# Patient Record
Sex: Male | Born: 1990 | Race: White | Hispanic: No | Marital: Single
Health system: Southern US, Community
[De-identification: ages and names within clinical notes are randomized; demographics above are authoritative.]

---
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
## 2011-06-23 LAB — URINALYSIS, ROUTINE W REFLEX MICROSCOPIC
Bilirubin Urine: NEGATIVE
Glucose, UA: NEGATIVE
Hgb urine dipstick: NEGATIVE
Ketones, ur: NEGATIVE
Protein, ur: NEGATIVE
pH: 6

## 2011-06-23 LAB — CBC
HCT: 44.4
Hemoglobin: 16
MCHC: 36
RDW: 13.1

## 2011-06-23 LAB — DIFFERENTIAL
Basophils Absolute: 0.1
Basophils Relative: 1
Eosinophils Relative: 7 — ABNORMAL HIGH
Monocytes Absolute: 1.1
Monocytes Relative: 10

## 2011-06-23 LAB — BASIC METABOLIC PANEL
CO2: 26
Chloride: 103
Glucose, Bld: 91
Potassium: 3.8
Sodium: 140

## 2011-06-23 LAB — DRUGS OF ABUSE SCREEN W/O ALC, ROUTINE URINE
Barbiturate Quant, Ur: NEGATIVE
Benzodiazepines.: NEGATIVE
Cocaine Metabolites: NEGATIVE
Methadone: NEGATIVE
Phencyclidine (PCP): NEGATIVE

## 2011-06-23 LAB — HEPATIC FUNCTION PANEL
ALT: 17
Albumin: 3.6
Alkaline Phosphatase: 91
Total Bilirubin: 1.3 — ABNORMAL HIGH

## 2011-06-23 LAB — THC (MARIJUANA), URINE, CONFIRMATION: Marijuana, Ur-Confirmation: 400 ng/mL

## 2011-06-23 LAB — RPR: RPR Ser Ql: NONREACTIVE

## 2020-08-23 ENCOUNTER — Other Ambulatory Visit: Payer: Self-pay

## 2020-08-23 ENCOUNTER — Inpatient Hospital Stay (HOSPITAL_COMMUNITY)
Admission: EM | Admit: 2020-08-23 | Discharge: 2020-09-12 | DRG: 871 | Disposition: E | Payer: Medicaid Other | Attending: Internal Medicine | Admitting: Internal Medicine

## 2020-08-23 ENCOUNTER — Emergency Department (HOSPITAL_COMMUNITY): Payer: Medicaid Other

## 2020-08-23 DIAGNOSIS — I469 Cardiac arrest, cause unspecified: Secondary | ICD-10-CM | POA: Diagnosis present

## 2020-08-23 DIAGNOSIS — I5021 Acute systolic (congestive) heart failure: Secondary | ICD-10-CM | POA: Diagnosis not present

## 2020-08-23 DIAGNOSIS — J690 Pneumonitis due to inhalation of food and vomit: Secondary | ICD-10-CM | POA: Diagnosis present

## 2020-08-23 DIAGNOSIS — G936 Cerebral edema: Secondary | ICD-10-CM | POA: Diagnosis present

## 2020-08-23 DIAGNOSIS — Z9289 Personal history of other medical treatment: Secondary | ICD-10-CM

## 2020-08-23 DIAGNOSIS — E876 Hypokalemia: Secondary | ICD-10-CM | POA: Diagnosis not present

## 2020-08-23 DIAGNOSIS — G929 Unspecified toxic encephalopathy: Secondary | ICD-10-CM | POA: Diagnosis present

## 2020-08-23 DIAGNOSIS — G9382 Brain death: Secondary | ICD-10-CM | POA: Diagnosis present

## 2020-08-23 DIAGNOSIS — N179 Acute kidney failure, unspecified: Secondary | ICD-10-CM | POA: Diagnosis present

## 2020-08-23 DIAGNOSIS — G931 Anoxic brain damage, not elsewhere classified: Secondary | ICD-10-CM | POA: Diagnosis present

## 2020-08-23 DIAGNOSIS — Z01818 Encounter for other preprocedural examination: Secondary | ICD-10-CM

## 2020-08-23 DIAGNOSIS — E860 Dehydration: Secondary | ICD-10-CM | POA: Diagnosis present

## 2020-08-23 DIAGNOSIS — R6520 Severe sepsis without septic shock: Secondary | ICD-10-CM | POA: Diagnosis present

## 2020-08-23 DIAGNOSIS — J9601 Acute respiratory failure with hypoxia: Secondary | ICD-10-CM | POA: Diagnosis present

## 2020-08-23 DIAGNOSIS — Z515 Encounter for palliative care: Secondary | ICD-10-CM | POA: Diagnosis not present

## 2020-08-23 DIAGNOSIS — Z66 Do not resuscitate: Secondary | ICD-10-CM | POA: Diagnosis not present

## 2020-08-23 DIAGNOSIS — R739 Hyperglycemia, unspecified: Secondary | ICD-10-CM | POA: Diagnosis present

## 2020-08-23 DIAGNOSIS — Z20822 Contact with and (suspected) exposure to covid-19: Secondary | ICD-10-CM | POA: Diagnosis present

## 2020-08-23 DIAGNOSIS — G935 Compression of brain: Secondary | ICD-10-CM | POA: Diagnosis present

## 2020-08-23 DIAGNOSIS — A419 Sepsis, unspecified organism: Principal | ICD-10-CM | POA: Diagnosis present

## 2020-08-23 DIAGNOSIS — Z529 Donor of unspecified organ or tissue: Secondary | ICD-10-CM

## 2020-08-23 LAB — I-STAT ARTERIAL BLOOD GAS, ED
Acid-Base Excess: 0 mmol/L (ref 0.0–2.0)
Acid-base deficit: 14 mmol/L — ABNORMAL HIGH (ref 0.0–2.0)
Bicarbonate: 18.9 mmol/L — ABNORMAL LOW (ref 20.0–28.0)
Bicarbonate: 25.2 mmol/L (ref 20.0–28.0)
Calcium, Ion: 1.36 mmol/L (ref 1.15–1.40)
Calcium, Ion: 1.19 mmol/L (ref 1.15–1.40)
HCT: 38 % — ABNORMAL LOW (ref 39.0–52.0)
HCT: 41 % (ref 39.0–52.0)
Hemoglobin: 12.9 g/dL — ABNORMAL LOW (ref 13.0–17.0)
Hemoglobin: 13.9 g/dL (ref 13.0–17.0)
O2 Saturation: 90 %
O2 Saturation: 100 %
Patient temperature: 98.6
Patient temperature: 92.5
Potassium: 3.3 mmol/L — ABNORMAL LOW (ref 3.5–5.1)
Potassium: 3.2 mmol/L — ABNORMAL LOW (ref 3.5–5.1)
Sodium: 133 mmol/L — ABNORMAL LOW (ref 135–145)
Sodium: 136 mmol/L (ref 135–145)
TCO2: 21 mmol/L — ABNORMAL LOW (ref 22–32)
TCO2: 27 mmol/L (ref 22–32)
pCO2 arterial: 79.9 mmHg (ref 32.0–48.0)
pCO2 arterial: 36.6 mmHg (ref 32.0–48.0)
pH, Arterial: 6.982 — CL (ref 7.350–7.450)
pH, Arterial: 7.432 (ref 7.350–7.450)
pO2, Arterial: 91 mmHg (ref 83.0–108.0)
pO2, Arterial: 489 mmHg — ABNORMAL HIGH (ref 83.0–108.0)

## 2020-08-23 LAB — CBC WITH DIFFERENTIAL/PLATELET
Abs Immature Granulocytes: 0.75 10*3/uL — ABNORMAL HIGH (ref 0.00–0.07)
Basophils Absolute: 0.1 10*3/uL (ref 0.0–0.1)
Basophils Relative: 1 %
Eosinophils Absolute: 0.3 10*3/uL (ref 0.0–0.5)
Eosinophils Relative: 2 %
HCT: 40.5 % (ref 39.0–52.0)
Hemoglobin: 12.1 g/dL — ABNORMAL LOW (ref 13.0–17.0)
Immature Granulocytes: 7 %
Lymphocytes Relative: 52 %
Lymphs Abs: 5.5 10*3/uL — ABNORMAL HIGH (ref 0.7–4.0)
MCH: 30.7 pg (ref 26.0–34.0)
MCHC: 29.9 g/dL — ABNORMAL LOW (ref 30.0–36.0)
MCV: 102.8 fL — ABNORMAL HIGH (ref 80.0–100.0)
Monocytes Absolute: 0.5 10*3/uL (ref 0.1–1.0)
Monocytes Relative: 5 %
Neutro Abs: 3.5 10*3/uL (ref 1.7–7.7)
Neutrophils Relative %: 33 %
Platelets: 252 10*3/uL (ref 150–400)
RBC: 3.94 MIL/uL — ABNORMAL LOW (ref 4.22–5.81)
RDW: 12.6 % (ref 11.5–15.5)
WBC: 10.6 10*3/uL — ABNORMAL HIGH (ref 4.0–10.5)
nRBC: 0 % (ref 0.0–0.2)

## 2020-08-23 LAB — I-STAT CHEM 8, ED
BUN: 7 mg/dL — ABNORMAL LOW (ref 8–23)
Calcium, Ion: 1.46 mmol/L — ABNORMAL HIGH (ref 1.15–1.40)
Chloride: 103 mmol/L (ref 98–111)
Creatinine, Ser: 0.7 mg/dL (ref 0.61–1.24)
Glucose, Bld: 196 mg/dL — ABNORMAL HIGH (ref 70–99)
HCT: 31 % — ABNORMAL LOW (ref 39.0–52.0)
Hemoglobin: 10.5 g/dL — ABNORMAL LOW (ref 13.0–17.0)
Potassium: 4.4 mmol/L (ref 3.5–5.1)
Sodium: 134 mmol/L — ABNORMAL LOW (ref 135–145)
TCO2: 20 mmol/L — ABNORMAL LOW (ref 22–32)

## 2020-08-23 LAB — RAPID URINE DRUG SCREEN, HOSP PERFORMED
Amphetamines: POSITIVE — AB
Barbiturates: NOT DETECTED
Benzodiazepines: POSITIVE — AB
Cocaine: NOT DETECTED
Opiates: NOT DETECTED
Tetrahydrocannabinol: NOT DETECTED

## 2020-08-23 LAB — ETHANOL: Alcohol, Ethyl (B): <10 mg/dL (ref <10)

## 2020-08-23 LAB — CBG MONITORING, ED: Glucose-Capillary: 257 mg/dL — ABNORMAL HIGH (ref 70–99)

## 2020-08-23 LAB — HEMOGLOBIN A1C
Hgb A1c MFr Bld: 4.6 % — ABNORMAL LOW (ref 4.8–5.6)
Mean Plasma Glucose: 85.32 mg/dL

## 2020-08-23 LAB — RESP PANEL BY RT-PCR (FLU A&B, COVID) ARPGX2
Influenza A by PCR: NEGATIVE
Influenza B by PCR: NEGATIVE
SARS Coronavirus 2 by RT PCR: NEGATIVE

## 2020-08-23 LAB — LACTIC ACID, PLASMA
Lactic Acid, Venous: >11 mmol/L (ref 0.5–1.9)
Lactic Acid, Venous: 7.8 mmol/L (ref 0.5–1.9)
Lactic Acid, Venous: 4.2 mmol/L (ref 0.5–1.9)

## 2020-08-23 LAB — SALICYLATE LEVEL: Salicylate Lvl: <7 mg/dL — ABNORMAL LOW (ref 7.0–30.0)

## 2020-08-23 LAB — GLUCOSE, CAPILLARY: Glucose-Capillary: 247 mg/dL — ABNORMAL HIGH (ref 70–99)

## 2020-08-23 LAB — ACETAMINOPHEN LEVEL: Acetaminophen (Tylenol), Serum: <10 ug/mL — ABNORMAL LOW (ref 10–30)

## 2020-08-23 LAB — MAGNESIUM: Magnesium: 4.6 mg/dL — ABNORMAL HIGH (ref 1.7–2.4)

## 2020-08-23 LAB — TROPONIN I (HIGH SENSITIVITY)
Troponin I (High Sensitivity): 46 ng/L — ABNORMAL HIGH (ref <18)
Troponin I (High Sensitivity): 674 ng/L (ref <18)

## 2020-08-23 MED ORDER — AMIODARONE HCL 150 MG/3ML IV SOLN
INTRAVENOUS | Status: AC | PRN
Start: 2020-08-23 — End: 2020-08-23
  Administered 2020-08-23: 300 mg via INTRAVENOUS

## 2020-08-23 MED ORDER — EPINEPHRINE HCL 5 MG/250ML IV SOLN IN NS
INTRAVENOUS | Status: AC
  Filled 2020-08-23: qty 250

## 2020-08-23 MED ORDER — PANTOPRAZOLE SODIUM 40 MG PO TBEC: 40.0000 mg | DELAYED_RELEASE_TABLET | Freq: Every day | ORAL | Status: DC

## 2020-08-23 MED ORDER — POLYETHYLENE GLYCOL 3350 17 G PO PACK: 17.0000 g | PACK | Freq: Every day | ORAL | Status: DC | PRN

## 2020-08-23 MED ORDER — MAGNESIUM SULFATE 50 % IJ SOLN
INTRAMUSCULAR | Status: AC | PRN
  Administered 2020-08-23: 2 g via INTRAVENOUS

## 2020-08-23 MED ORDER — HEPARIN SODIUM (PORCINE) 5000 UNIT/ML IJ SOLN
5000.0000 [IU] | Freq: Three times a day (TID) | INTRAMUSCULAR | Status: DC
  Administered 2020-08-23 – 2020-08-26 (×10): 5000 [IU] via SUBCUTANEOUS
  Filled 2020-08-23 (×10): qty 1

## 2020-08-23 MED ORDER — EPINEPHRINE HCL 5 MG/250ML IV SOLN IN NS: 0.5000 ug/min | INTRAVENOUS | Status: DC

## 2020-08-23 MED ORDER — NALOXONE HCL 2 MG/2ML IJ SOSY
PREFILLED_SYRINGE | INTRAMUSCULAR | Status: AC | PRN
Start: 2020-08-23 — End: 2020-08-23
  Administered 2020-08-23: 2 mg via INTRAVENOUS

## 2020-08-23 MED ORDER — MIDAZOLAM HCL 2 MG/2ML IJ SOLN: 2.0000 mg | INTRAMUSCULAR | Status: DC | PRN

## 2020-08-23 MED ORDER — NOREPINEPHRINE 4 MG/250ML-% IV SOLN
INTRAVENOUS | Status: AC
  Administered 2020-08-23: 17:00:00 120 ug/min via INTRAVENOUS
  Filled 2020-08-23: qty 250

## 2020-08-23 MED ORDER — NOREPINEPHRINE 4 MG/250ML-% IV SOLN: 0.0000 ug/min | INTRAVENOUS | Status: DC

## 2020-08-23 MED ORDER — INSULIN ASPART 100 UNIT/ML ~~LOC~~ SOLN
0.0000 [IU] | SUBCUTANEOUS | Status: DC
  Administered 2020-08-24: 3 [IU] via SUBCUTANEOUS
  Administered 2020-08-24 – 2020-08-26 (×4): 1 [IU] via SUBCUTANEOUS

## 2020-08-23 MED ORDER — SODIUM BICARBONATE 8.4 % IV SOLN
INTRAVENOUS | Status: AC | PRN
  Administered 2020-08-23: 50 meq via INTRAVENOUS

## 2020-08-23 MED ORDER — CALCIUM CHLORIDE 10 % IV SOLN
INTRAVENOUS | Status: AC | PRN
  Administered 2020-08-23: 1 g via INTRAVENOUS

## 2020-08-23 MED ORDER — DOCUSATE SODIUM 100 MG PO CAPS: 100.0000 mg | ORAL_CAPSULE | Freq: Two times a day (BID) | ORAL | Status: DC | PRN

## 2020-08-23 MED ORDER — CHLORHEXIDINE GLUCONATE 0.12% ORAL RINSE (MEDLINE KIT)
15.0000 mL | Freq: Two times a day (BID) | OROMUCOSAL | Status: DC
  Administered 2020-08-24 – 2020-08-28 (×10): 15 mL via OROMUCOSAL

## 2020-08-23 MED ORDER — ORAL CARE MOUTH RINSE
15.0000 mL | OROMUCOSAL | Status: DC
  Administered 2020-08-24 – 2020-08-28 (×44): 15 mL via OROMUCOSAL

## 2020-08-23 MED ORDER — STERILE WATER FOR INJECTION IV SOLN
INTRAVENOUS | Status: AC
  Filled 2020-08-23 (×2): qty 850

## 2020-08-23 MED ORDER — EPINEPHRINE 1 MG/10ML IJ SOSY
PREFILLED_SYRINGE | INTRAMUSCULAR | Status: AC | PRN
Start: 2020-08-23 — End: 2020-08-23
  Administered 2020-08-23 (×4): 1 mg via INTRAVENOUS

## 2020-08-23 MED ORDER — NALOXONE HCL 2 MG/2ML IJ SOSY
PREFILLED_SYRINGE | INTRAMUSCULAR | Status: AC
  Filled 2020-08-23: qty 4

## 2020-08-23 NOTE — ED Provider Notes (Signed)
[facility] EMERGENCY DEPARTMENT Provider Note   CSN: [id number] Arrival date & time: 09/09/2020  1639     History CC: Cardiac arrest  Gevena Barre Doe is a 29 y.o. male is a ~89 yoM with unknown past medical history who presented in cardiac arrest.  Per EMS report, patient was found down at [place], pulseless and apneic.  Per EMS, he was sitting on the ground with his head down in a backpack with surrounding emesis.  There was initial concern for drug overdose.  Patient was initially in PEA so CPR was started around 1600.  Patient continued to be in PEA so was given 150 mg amiodarone, epi x6.  He was then found to be in V. fib so was shocked once.  He was also intubated with an 8 oh ET tube in the field.  CPR was continued patient was brought to the ED for further management.  There is no known history on the patient.    The history is provided by the EMS personnel. The history is limited by the condition of the patient.       No past medical history on file.  Patient Active Problem List   Diagnosis Date Noted  . Cardiac arrest ([place]) 08/25/2020       No family history on file.     Home Medications Prior to Admission medications   Not on File    Allergies    Patient has no allergy information on record.  Review of Systems   Review of Systems  Unable to perform ROS: Patient unresponsive    Physical Exam Updated Vital Signs BP 137/77   Pulse (!) 47   Temp (!) 95 F (35 C)   Resp (!) 27   Ht 5' 9"  (1.753 m)   Wt 70 kg   SpO2 100%   BMI 22.79 kg/m   Physical Exam Constitutional:      General: He is in acute distress.     Appearance: He is normal weight. He is ill-appearing and toxic-appearing.     Interventions: He is intubated.  HENT:     Head: Normocephalic and atraumatic.     Right Ear: External ear normal.     Left Ear: External ear normal.  Eyes:     Pupils: Pupils are equal.     Right eye: Pupil is not reactive. Pupil is  round.     Left eye: Pupil is not reactive. Pupil is round.     Comments: 60m, nonreactive pupils bilaterally  Neck:     Comments: In c-collar Cardiovascular:     [facility]: [facility]:     Effort: He is intubated.     Comments: Bilateral breath sounds Abdominal:     General: There is no distension.  Musculoskeletal:     Right lower leg: No edema.     Left lower leg: No edema.  Skin:    General: Skin is dry.  Neurological:     Mental Status: He is unresponsive.     GCS: GCS eye subscore is 1. GCS verbal subscore is 1. GCS motor subscore is 1.     Comments: No posturing.  No response to pain.     ED Results / Procedures / Treatments   Labs (all labs ordered are listed, but only abnormal results are displayed) Labs Reviewed  CBC WITH DIFFERENTIAL/PLATELET - Abnormal; Notable for the following components:      Result Value  WBC 10.6 (*)    RBC 3.94 (*)    Hemoglobin 12.1 (*)    MCV 102.8 (*)    MCHC 29.9 (*)    Lymphs Abs 5.5 (*)    Abs Immature Granulocytes 0.75 (*)    All other components within normal limits  COMPREHENSIVE METABOLIC PANEL - Abnormal; Notable for the following components:   Sodium 133 (*)    Chloride 97 (*)    CO2 17 (*)    Glucose, Bld 266 (*)    BUN 6 (*)    Calcium 10.6 (*)    Total Protein 4.7 (*)    Albumin 2.5 (*)    AST 97 (*)    ALT 62 (*)    Anion gap 19 (*)    All other components within normal limits  LACTIC ACID, PLASMA - Abnormal; Notable for the following components:   Lactic Acid, Venous >11.0 (*)    All other components within normal limits  LACTIC ACID, PLASMA - Abnormal; Notable for the following components:   Lactic Acid, Venous 7.8 (*)    All other components within normal limits  ACETAMINOPHEN LEVEL - Abnormal; Notable for the following components:   Acetaminophen (Tylenol), Serum <10 (*)    All other components within normal limits  SALICYLATE LEVEL - Abnormal; Notable for the  following components:   Salicylate Lvl <7.1 (*)    All other components within normal limits  MAGNESIUM - Abnormal; Notable for the following components:   Magnesium 4.6 (*)    All other components within normal limits  RAPID URINE DRUG SCREEN, HOSP PERFORMED - Abnormal; Notable for the following components:   Benzodiazepines POSITIVE (*)    Amphetamines POSITIVE (*)    All other components within normal limits  LACTIC ACID, PLASMA - Abnormal; Notable for the following components:   Lactic Acid, Venous 4.2 (*)    All other components within normal limits  HEMOGLOBIN A1C - Abnormal; Notable for the following components:   Hgb A1c MFr Bld 4.6 (*)    All other components within normal limits  GLUCOSE, CAPILLARY - Abnormal; Notable for the following components:   Glucose-Capillary 247 (*)    All other components within normal limits  I-STAT CHEM 8, ED - Abnormal; Notable for the following components:   Sodium 134 (*)    BUN 7 (*)    Glucose, Bld 196 (*)    Calcium, Ion 1.46 (*)    TCO2 20 (*)    Hemoglobin 10.5 (*)    HCT 31.0 (*)    All other components within normal limits  CBG MONITORING, ED - Abnormal; Notable for the following components:   Glucose-Capillary 257 (*)    All other components within normal limits  I-STAT ARTERIAL BLOOD GAS, ED - Abnormal; Notable for the following components:   pH, Arterial 6.982 (*)    pCO2 arterial 79.9 (*)    Bicarbonate 18.9 (*)    TCO2 21 (*)    Acid-base deficit 14.0 (*)    Sodium 133 (*)    Potassium 3.3 (*)    HCT 38.0 (*)    Hemoglobin 12.9 (*)    All other components within normal limits  I-STAT ARTERIAL BLOOD GAS, ED - Abnormal; Notable for the following components:   pO2, Arterial 489 (*)    Potassium 3.2 (*)    All other components within normal limits  TROPONIN I (HIGH SENSITIVITY) - Abnormal; Notable for the following components:   Troponin I (High Sensitivity) 46 (*)  All other components within normal limits  TROPONIN  I (HIGH SENSITIVITY) - Abnormal; Notable for the following components:   Troponin I (High Sensitivity) 674 (*)    All other components within normal limits  RESP PANEL BY RT-PCR (FLU A&B, COVID) ARPGX2  CULTURE, BLOOD (ROUTINE X 2)  CULTURE, BLOOD (ROUTINE X 2)  ETHANOL  BLOOD GAS, ARTERIAL  DRUG SCREEN 10 W/CONF, SERUM  BLOOD GAS, ARTERIAL  CBC  BASIC METABOLIC PANEL  MAGNESIUM  PATHOLOGIST SMEAR REVIEW  LACTIC ACID, PLASMA    EKG EKG Interpretation  Date/Time:  Sunday August 23 2020 17:34:29 EST Ventricular Rate:  86 PR Interval:    QRS Duration: 114 QT Interval:  430 QTC Calculation: 515 R Axis:   73 Text Interpretation: Sinus rhythm Borderline intraventricular conduction delay Prolonged QT interval No previous ECGs available Confirmed by Wandra Arthurs [phone]) on 09/04/2020 5:36:51 PM   Radiology CT Head Wo Contrast  Result Date: 08/30/2020 CLINICAL DATA:  Found pulseless, apneic EXAM: CT HEAD WITHOUT CONTRAST CT CERVICAL SPINE WITHOUT CONTRAST TECHNIQUE: Multidetector CT imaging of the head and cervical spine was performed following the standard protocol without intravenous contrast. Multiplanar CT image reconstructions of the cervical spine were also generated. COMPARISON:  None. FINDINGS: CT HEAD FINDINGS Brain: There is subtle, diffuse hypodensity of the brain parenchyma, loss of gray-white distinction, sulcal effacement, and pseudohyperdensity and effacement of the basal cisterns (series 3, image 13). Vascular: No hyperdense vessel or unexpected calcification. Skull: Normal. Negative for fracture or focal lesion. Sinuses/Orbits: Mucosal thickening and partial opacification of ethmoid air cells, bilateral maxillary sinuses, and sphenoid sinuses. Other: None. CT CERVICAL SPINE FINDINGS Examination of the cervical spine is limited by motion artifact. Alignment: Straightening of the normal cervical lordosis, likely positional. Skull base and vertebrae: No definite evidence of  fracture. There is a motion artifact extending through the base of the odontoid process and C2 vertebral body (series 7, image 29). No primary bone lesion or focal pathologic process. Soft tissues and spinal canal: No prevertebral fluid or swelling. No visible canal hematoma. Disc levels:  Intact. Upper chest: Negative. Other: None. IMPRESSION: 1. There is subtle, diffuse hypodensity of the brain parenchyma, loss of gray-white distinction, sulcal effacement, and pseudohyperdensity and effacement of the basal cisterns. Findings are consistent with diffuse cerebral edema and global anoxic injury. 2. Examination of the cervical spine is limited by motion artifact. No definite evidence of fracture or static subluxation of the cervical spine. There is a motion artifact extending through the base of the odontoid process and C2 vertebral body, not favored to reflect a fracture. These results were called by telephone at the time of interpretation on 08/17/2020 at 7:03 pm to Dr. Shirlyn Goltz , who verbally acknowledged these results. Electronically Signed   By: Eddie Candle M.D.   On: 08/17/2020 19:07   CT Cervical Spine Wo Contrast  Result Date: 09/06/2020 CLINICAL DATA:  Found pulseless, apneic EXAM: CT HEAD WITHOUT CONTRAST CT CERVICAL SPINE WITHOUT CONTRAST TECHNIQUE: Multidetector CT imaging of the head and cervical spine was performed following the standard protocol without intravenous contrast. Multiplanar CT image reconstructions of the cervical spine were also generated. COMPARISON:  None. FINDINGS: CT HEAD FINDINGS Brain: There is subtle, diffuse hypodensity of the brain parenchyma, loss of gray-white distinction, sulcal effacement, and pseudohyperdensity and effacement of the basal cisterns (series 3, image 13). Vascular: No hyperdense vessel or unexpected calcification. Skull: Normal. Negative for fracture or focal lesion. Sinuses/Orbits: Mucosal thickening and partial opacification of ethmoid  air cells,  bilateral maxillary sinuses, and sphenoid sinuses. Other: None. CT CERVICAL SPINE FINDINGS Examination of the cervical spine is limited by motion artifact. Alignment: Straightening of the normal cervical lordosis, likely positional. Skull base and vertebrae: No definite evidence of fracture. There is a motion artifact extending through the base of the odontoid process and C2 vertebral body (series 7, image 29). No primary bone lesion or focal pathologic process. Soft tissues and spinal canal: No prevertebral fluid or swelling. No visible canal hematoma. Disc levels:  Intact. Upper chest: Negative. Other: None. IMPRESSION: 1. There is subtle, diffuse hypodensity of the brain parenchyma, loss of gray-white distinction, sulcal effacement, and pseudohyperdensity and effacement of the basal cisterns. Findings are consistent with diffuse cerebral edema and global anoxic injury. 2. Examination of the cervical spine is limited by motion artifact. No definite evidence of fracture or static subluxation of the cervical spine. There is a motion artifact extending through the base of the odontoid process and C2 vertebral body, not favored to reflect a fracture. These results were called by telephone at the time of interpretation on 09/04/2020 at 7:03 pm to Dr. Shirlyn Goltz , who verbally acknowledged these results. Electronically Signed   By: Eddie Candle M.D.   On: 08/30/2020 19:07   DG Chest Port 1 View  Result Date: 08/18/2020 CLINICAL DATA:  Intubation, central line placement EXAM: PORTABLE CHEST 1 VIEW COMPARISON:  None. FINDINGS: Endotracheal tube tip terminates in the mid to upper trachea, 6.2 cm from the carina. Transesophageal tube tip and side port terminate below the margins of imaging, beyond the GE junction. Telemetry leads and pacer pads overlie the chest wall. Central venous catheter is not visualized within the margins of imaging. There is some mild airways thickening and mixed streaky and patchy opacity in the  medial lung bases. The cardiomediastinal contours are unremarkable. No acute osseous or soft tissue abnormality. IMPRESSION: 1. Endotracheal tube tip terminates in the mid to upper trachea, 6.2 cm from the carina. 2. Transesophageal tube tip and side port terminate below the margins of imaging, beyond the GE junction. 3. A central venous catheter is not visualized within the margins of imaging. 4. Airways thickening and mixed interstitial and patchy opacities, could reflect acute infection or inflammation in the appropriate clinical setting. Electronically Signed   By: Lovena Le M.D.   On: 09/01/2020 17:48    Procedures .Central Line  Date/Time: 09/11/2020 11:51 PM Performed by: Darrick Huntsman, MD Authorized by: Drenda Freeze, MD   Consent:    Consent obtained:  Emergent situation Pre-procedure details:    Indication(s): central venous access and insufficient peripheral access     All elements of maximal sterile barrier technique followed: Sterile techniques were followed the best of our ability given emergent situationand limitations of this.     Skin preparation:  Povidone-iodine   Skin preparation agent: Skin preparation agent completely dried prior to procedure   Sedation:    Sedation type:  None Procedure details:    Location:  R femoral   Site selection rationale:  C-collar so IJ not accessible, c/f neck trauma   Patient position:  Supine   Procedural supplies:  Triple lumen   Catheter size:  7 Fr   Landmarks identified: yes     Ultrasound guidance: yes     Ultrasound guidance timing: real time     Sterile ultrasound techniques: Sterile gel and sterile probe covers were used     Number of attempts:  1  Successful placement: yes   Post-procedure details:    Post-procedure:  Dressing applied and line sutured   Assessment:  Blood return through all ports and free fluid flow   Procedure completion:  Tolerated well, no immediate complications     Medications Ordered  in ED Medications  naloxone (NARCAN) 2 MG/2ML injection (has no administration in time range)  EPINEPHrine NaCl 4-0.9 MG/250ML-% premix infusion (has no administration in time range)  norepinephrine (LEVOPHED) 18m in 2552mpremix infusion (0 mcg/min Intravenous Stopped 08/22/2020 1750)  EPINEPHrine (ADRENALIN) 4 mg in NS 250 mL (0.016 mg/mL) premix infusion (0.5 mcg/min Intravenous Not Given 09/09/2020 1752)  sodium bicarbonate 150 mEq in sterile water 1,000 mL infusion ( Intravenous Infusion Verify 09/03/2020 2200)  docusate sodium (COLACE) capsule 100 mg (has no administration in time range)  polyethylene glycol (MIRALAX / GLYCOLAX) packet 17 g (has no administration in time range)  heparin injection 5,000 Units (5,000 Units Subcutaneous Given 08/24/2020 2314)  pantoprazole (PROTONIX) EC tablet 40 mg (40 mg Oral Not Given 08/19/2020 1913)  midazolam (VERSED) injection 2 mg (has no administration in time range)  insulin aspart (novoLOG) injection 0-9 Units (has no administration in time range)  chlorhexidine gluconate (MEDLINE KIT) (PERIDEX) 0.12 % solution 15 mL (has no administration in time range)  MEDLINE mouth rinse (has no administration in time range)  EPINEPHrine (ADRENALIN) 1 MG/10ML injection (1 mg Intravenous Given 09/04/2020 1651)  sodium bicarbonate injection (50 mEq Intravenous Given 08/14/2020 1642)  calcium chloride injection (1 g Intravenous Given 09/08/2020 1644)  magnesium sulfate (IV Push/IM) injection (2 g Intravenous Given 09/03/2020 1647)  amiodarone (CORDARONE) injection (300 mg Intravenous Given 08/30/2020 1649)  naloxone (NARCAN) injection (2 mg Intravenous Given 09/06/2020 1653)  sodium bicarbonate injection (50 mEq Intravenous Given 08/19/2020 1751)    ED Course  I have reviewed the triage vital signs and the nursing notes.  Pertinent labs & imaging results that were available during my care of the patient were reviewed by me and considered in my medical decision making (see chart for  details).    MDM Rules/Calculators/A&P                          ED Course/MDM: PoTrellis Momentoe is a [age].o. male who presents with cardiac arrest as per above. I have reviewed the nursing documentation for past medical history, family history, and social history. Pertinent previous records reviewed. He is unresponsive, intubated without sedation.  93.1 F. Physical exam is most notable for pulseless.  Labs: ABG 6.982/79.9/91, Covid negative, glucose 257, WBC 10.6, hemoglobin 12.1, bicarb 17, mild transaminitis, AG 19, undetectable ethanol, troponin 46.  Lactic above measurable limit.  UDS positive for benzos and amphetamines.  Remaining lab work pending at time of admission. EKG: NSR, nonspecific ST changes.Borderline prolonged QTc. No signs of acute ischemia, infarct, or significant electrical abnormalities. No STEMI, ST depressions, or significant T wave inversions. No evidence of a High-Grade Conduction Block, WPW, Brugada Sign, ARVC, DeWinters T Waves, or Wellens Waves. Imaging: CT head and C-spine demonstrating findings concerning for diffuse cerebral edema and global anoxic injury.  No obvious cervical spine fracture.  CXR with a ET tube in trachea; 6.2 cm from carina. Consults: Crit care   MDM: PoGevena Barreoe is a [age].o. male presents with out-of-hospital cardiac arrest.  On patient arrival, had been undergoing CPR for about 40 minutes with epi x6 and shocked x1.  CPR was continued and patient was given  epinephrine, sodium bicarb, calcium chloride.  Patient underwent multiple rounds of CPR.  Had PEA but then on repeat pulse check, had V. fib so patient was shocked.  Given 300 mg amiodarone.  CPR restarted and on subsequent rhythm checks, had V. fib versus torsades.  Thus patient was given magnesium, shocked, and CPR was continued.  Patient was given an additional 2 mg Narcan.  After about 54 total minutes of prehospital and in hospital CPR, the patient's pulses returned.  He remained  hypotensive so was placed on epi drip and then levo drip.  Max levo was at 120.  Patient did not have another code event while in the ED.  Placed on vent via ET tube placed by EMS.  Patient maintained a good pulse and perfusion with assistance of pressors which were weaned.  There are patient's 80s care, he did not make any purposeful movements and there is concern for severe underlying hypoxic brain injury.  Nonsterile/crash central line placed as above. Handoff given to critical care team including nonsterile technique of central line.  Patient admitted in tenuous but stable condition on pressors.  The plan for this patient was discussed with Dr. Darl Householder, who voiced agreement and who oversaw evaluation and treatment of this patient.   Final Clinical Impression(s) / ED Diagnoses Final diagnoses:  Cardiac arrest [facility])    Rx / DC Orders ED Discharge Orders    None       Aundrea Horace, MD 08/22/2020 2355    Drenda Freeze, MD 08/29/20 [phone]

## 2020-08-23 NOTE — Progress Notes (Signed)
Called to patients room patient was desating in the 80's.  Increased FI02 to 100% and placed a bite block due to patient biting tube.  RT will continue to monitor and titrate FI02 as tolerated.

## 2020-08-23 NOTE — Progress Notes (Addendum)
eLink Physician-Brief Progress Note Patient Name: Terry Mathews DOB: 09/12/1875 MRN: [id number]   Date of Service  08/22/2020  HPI/Events of Note  Multiple issues: 1. Patient biting on ETT --> desaturation and 2. Hyperglycemia - Blood glucose = 257.   eICU Interventions  Plan: 1. Versed 2 mg IV PRN biting on ETT. 2. Nursing to place bite block.  3. Q 4 hour sensitive Novolog SSI.      Intervention Category Major Interventions: Hypoxemia - evaluation and management  [name] 09/06/2020, 9:43 PM

## 2020-08-23 NOTE — H&P (Signed)
NAME:  Terry Mathews, MRN:  [id number], DOB:  09/12/1875, LOS: 0 ADMISSION DATE:  09/10/2020, CONSULTATION DATE: 08/19/2020 REFERRING MD: Emergency for physician, CHIEF COMPLAINT: Post PEA and V. fib arrest of approximately 50 minutes  Brief History   Unidentified male found down on D5  History of present illness   Unknown male fell down [facility] [place] and PEA arrest for unknown period of time.  Resuscitated with epi intubation multiple shocks if he was estimated ampuls.  Bicarbonate was given he does have a mild mixed metabolic acidosis.-Insulin sliding support.  Vasopressors off.  Remains in the intensive care unit with full expectation that he will code again.  Past Medical History  Unknown  Significant Hospital Events     Consults:    Procedures:  08/18/2020 intubation  Significant Diagnostic Tests:  09/04/2020 CT of the head>>  Micro Data:  08/17/2020 sputum culture  Antimicrobials:     Interim history/subjective:    Objective   Blood pressure 102/71, pulse 87, temperature (!) 92.6 F (33.7 C), resp. rate (!) 25, height 5\' 9"  (1.753 m), weight 70 kg, SpO2 100 %.    Vent Mode: PRVC FiO2 (%):  [100 %] 100 % Set Rate:  [22 bmp-30 bmp] 30 bmp Vt Set:  [560 mL] 560 mL PEEP:  [5 cmH20] 5 cmH20 Plateau Pressure:  [16 cmH20] 16 cmH20  No intake or output data in the 24 hours ending 08/25/2020 1821 Filed Weights   08/24/2020 1744  Weight: 70 kg    Examination: General: Ill-appearing male who is intubated not on sedation HENT: Endotracheal tube is in place.  Multiple facial tattoos are noted Lungs: Coarse rhonchi bilaterally Cardiovascular: Heart sounds regular rate rhythm bounding pulse Abdomen: Soft nontender Extremities: Cool positive capillary refill in the toes Neuro: Positive gag GU: Amber urine  Resolved Hospital Problem list     Assessment & Plan:  Unknown aged male who was found down at [employer] found to be in PEA intubated in the field  given epi x6 times along with amnio x1 in the field with CPR.  Transferred to intensive care at [facility] again had a PEA with epi bicarb.  V. fib with shock.  Currently intubated on full mechanical ventilatory support and off vasopressor support.  Pulmonary critical care asked to admit.  Ventilator dependent respiratory failure with severe acidosis Respiratory rate of 30 Bicarb Consider empirical antimicrobial therapy Sputum culture  Altered mental status in the setting of prolonged arrest of approximately 50 minutes UDS CT of the head EEG May need neurology consult  Further metabolic disarray is labs, and Monitor labs and correct as needed  Best practice (evaluated daily)   Diet: NPO Pain/Anxiety/Delirium protocol (if indicated): Not needed VAP protocol (if indicated): In place DVT prophylaxis: Compression hose GI prophylaxis: PPI Glucose control: Sliding scale insulin Mobility: Bedrest last date of multidisciplinary goals of care discussion unable Family and staff present unable unable Summary of discussion unable Follow up goals of care discussion due unable Code Status: Full Disposition: Intensive care unit  Labs   CBC: Recent Labs  Lab 08/17/2020 1658 09/07/2020 1739 09/07/2020 1749  WBC  --   --  10.6  NEUTROABS  --   --  PENDING  HGB 10.5 12.9 12.1  HCT 31.0 38.0 40.5  MCV  --   --  102.8  PLT  --   --  252    Basic Metabolic Panel: Recent Labs  Lab 08/25/2020 1658 08/12/2020 1739  NA 134 133  K 4.4 3.3  CL 103  --   GLUCOSE 196  --   BUN 7  --   CREATININE 0.70  --    GFR: Estimated Creatinine Clearance (by C-G formula based on SCr of 0.7 mg/dL) Male: -3.9 mL/min Male: -4.9 mL/min Recent Labs  Lab 2020/09/20 1749  WBC 10.6    Liver Function Tests: No results for input(s): AST, ALT, ALKPHOS, BILITOT, PROT, ALBUMIN in the last 168 hours. No results for input(s): LIPASE, AMYLASE in the last 168 hours. No results for input(s): AMMONIA in the last 168  hours.  ABG    Component Value Date/Time   PHART 6.982 09/20/20 1739   PCO2ART 79.9 2020-09-20 1739   PO2ART 91 Sep 20, 2020 1739   HCO3 18.9 09-20-20 1739   TCO2 21 20-Sep-2020 1739   ACIDBASEDEF 14.0 2020-09-20 1739   O2SAT 90.0 September 20, 2020 1739     Coagulation Profile: No results for input(s): INR, PROTIME in the last 168 hours.  Cardiac Enzymes: No results for input(s): CKTOTAL, CKMB, CKMBINDEX, TROPONINI in the last 168 hours.  HbA1C: No results found for: HGBA1C  CBG: Recent Labs  Lab 09/20/20 1657  GLUCAP 257    Review of Systems:   Unable  Past Medical History  Terry Mathews,  has no past medical history on file.   Surgical History      Social History      Family History   Terry Mathews's family history is not on file.   Allergies Not on File   Home Medications  Prior to Admission medications   Not on File     Critical care time: 45 min     Brett Canales Walaa Carel ACNP Acute Care Nurse Practitioner [facility] Pulmonary/Critical Care Please consult Amion 2020-09-20, 6:21 PM

## 2020-08-23 NOTE — Code Documentation (Signed)
Return of pulses-CPR stopped

## 2020-08-23 NOTE — Progress Notes (Signed)
eLink Physician-Brief Progress Note Patient Name: Ellery Plunk Doe DOB: 09/12/1875 MRN: [id number]   Date of Service  2020-08-27  HPI/Events of Note  Multiple loose stools - Nursing request for Flexiseal.   eICU Interventions  Plan: 1. Place Flexiseal.      Intervention Category Major Interventions: Other:  [name] 08/27/2020, 11:09 PM

## 2020-08-23 NOTE — Code Documentation (Addendum)
Patient arrived by [facility] after being found pulseless and apneic at [employer]. EMS states that patient was found sitting with head down in book bag with emesis occluding airway. Unsure if ingestion or cause of arrest.  Patient received the following prior to arrival- Asystole/PEA-V. Fib arrest Amiodarone 300 Epi x 6 Defib x 1  CBG 116 IO [place] airway Arrived with CPR in progress-time down 40 minutes prior to arrival

## 2020-08-23 NOTE — Progress Notes (Signed)
Patient came in intubated by EMS with 8.0 ETT taped at 24 cm at the lip,  with CPR in progress, good BBS ausculted, took over BVM until ROSC archived, then placed on the vent on above vent settings per ARDS net protocol, will continue to monitor patient.

## 2020-08-23 NOTE — Progress Notes (Signed)
Increased rate to 30 BPM, per MD's verbal order at bedside.

## 2020-08-24 ENCOUNTER — Inpatient Hospital Stay (HOSPITAL_COMMUNITY): Payer: Medicaid Other

## 2020-08-24 DIAGNOSIS — I469 Cardiac arrest, cause unspecified: Secondary | ICD-10-CM

## 2020-08-24 LAB — MRSA PCR SCREENING: MRSA by PCR: POSITIVE — AB

## 2020-08-24 LAB — GLUCOSE, CAPILLARY
Glucose-Capillary: 117 mg/dL — ABNORMAL HIGH (ref 70–99)
Glucose-Capillary: 123 mg/dL — ABNORMAL HIGH (ref 70–99)
Glucose-Capillary: 212 mg/dL — ABNORMAL HIGH (ref 70–99)
Glucose-Capillary: 70 mg/dL (ref 70–99)
Glucose-Capillary: 83 mg/dL (ref 70–99)
Glucose-Capillary: 86 mg/dL (ref 70–99)
Glucose-Capillary: 91 mg/dL (ref 70–99)

## 2020-08-24 LAB — ECHOCARDIOGRAM COMPLETE
Height: 69 in
Weight: 2455.04 oz

## 2020-08-24 LAB — CBC
HCT: 44.2 % (ref 39.0–52.0)
Hemoglobin: 14.9 g/dL (ref 13.0–17.0)
MCH: 30.1 pg (ref 26.0–34.0)
MCHC: 33.7 g/dL (ref 30.0–36.0)
MCV: 89.3 fL (ref 80.0–100.0)
Platelets: 408 10*3/uL — ABNORMAL HIGH (ref 150–400)
RBC: 4.95 MIL/uL (ref 4.22–5.81)
RDW: 12.8 % (ref 11.5–15.5)
WBC: 16.5 10*3/uL — ABNORMAL HIGH (ref 4.0–10.5)
nRBC: 0 % (ref 0.0–0.2)

## 2020-08-24 LAB — LACTIC ACID, PLASMA: Lactic Acid, Venous: 3.2 mmol/L (ref 0.5–1.9)

## 2020-08-24 LAB — PATHOLOGIST SMEAR REVIEW

## 2020-08-24 LAB — PHOSPHORUS: Phosphorus: 4.2 mg/dL (ref 2.5–4.6)

## 2020-08-24 LAB — MAGNESIUM
Magnesium: 2.4 mg/dL (ref 1.7–2.4)
Magnesium: 2.1 mg/dL (ref 1.7–2.4)

## 2020-08-24 MED ORDER — PIPERACILLIN-TAZOBACTAM 3.375 G IVPB
3.3750 g | Freq: Three times a day (TID) | INTRAVENOUS | Status: DC
  Administered 2020-08-24 – 2020-08-25 (×3): 3.375 g via INTRAVENOUS
  Filled 2020-08-24 (×4): qty 50

## 2020-08-24 MED ORDER — VANCOMYCIN HCL 1500 MG/300ML IV SOLN
1500.0000 mg | Freq: Once | INTRAVENOUS | Status: AC
  Administered 2020-08-24: 11:00:00 1500 mg via INTRAVENOUS
  Filled 2020-08-24: qty 300

## 2020-08-24 MED ORDER — CHLORHEXIDINE GLUCONATE CLOTH 2 % EX PADS
6.0000 | MEDICATED_PAD | Freq: Every day | CUTANEOUS | Status: DC
  Administered 2020-08-24 – 2020-08-28 (×3): 6 via TOPICAL

## 2020-08-24 MED ORDER — POLYETHYLENE GLYCOL 3350 17 G PO PACK: 17.0000 g | PACK | Freq: Every day | ORAL | Status: DC | PRN

## 2020-08-24 MED ORDER — VITAL HIGH PROTEIN PO LIQD
1000.0000 mL | ORAL | Status: DC
  Administered 2020-08-24: 17:00:00 1000 mL

## 2020-08-24 MED ORDER — MUPIROCIN 2 % EX OINT
1.0000 "application " | TOPICAL_OINTMENT | Freq: Two times a day (BID) | CUTANEOUS | Status: DC
  Administered 2020-08-24 – 2020-08-28 (×9): 1 via NASAL
  Filled 2020-08-24 (×6): qty 22

## 2020-08-24 MED ORDER — PROSOURCE TF PO LIQD
45.0000 mL | Freq: Two times a day (BID) | ORAL | Status: DC
  Administered 2020-08-24 – 2020-08-25 (×2): 45 mL
  Filled 2020-08-24 (×2): qty 45

## 2020-08-24 MED ORDER — IBUPROFEN 100 MG/5ML PO SUSP
200.0000 mg | Freq: Once | ORAL | Status: AC
  Administered 2020-08-24: 05:00:00 200 mg
  Filled 2020-08-24: qty 10

## 2020-08-24 MED ORDER — PIPERACILLIN-TAZOBACTAM 3.375 G IVPB 30 MIN
3.3750 g | INTRAVENOUS | Status: AC
  Administered 2020-08-24: 05:00:00 3.375 g via INTRAVENOUS
  Filled 2020-08-24: qty 50

## 2020-08-24 MED ORDER — POTASSIUM CHLORIDE 10 MEQ/50ML IV SOLN
10.0000 meq | INTRAVENOUS | Status: AC
  Administered 2020-08-24 (×4): 10 meq via INTRAVENOUS
  Filled 2020-08-24 (×2): qty 50

## 2020-08-24 MED ORDER — DOCUSATE SODIUM 50 MG/5ML PO LIQD: 100.0000 mg | Freq: Two times a day (BID) | ORAL | Status: DC | PRN

## 2020-08-24 MED ORDER — VANCOMYCIN HCL 750 MG/150ML IV SOLN
750.0000 mg | Freq: Three times a day (TID) | INTRAVENOUS | Status: DC
  Administered 2020-08-24 – 2020-08-28 (×12): 750 mg via INTRAVENOUS
  Filled 2020-08-24 (×13): qty 150

## 2020-08-24 MED ORDER — LACTATED RINGERS IV BOLUS
1000.0000 mL | Freq: Once | INTRAVENOUS | Status: AC
  Administered 2020-08-24: 11:00:00 1000 mL via INTRAVENOUS

## 2020-08-24 MED ORDER — PANTOPRAZOLE SODIUM 40 MG PO PACK
40.0000 mg | PACK | Freq: Every day | ORAL | Status: DC
  Administered 2020-08-24 – 2020-08-26 (×3): 40 mg
  Filled 2020-08-24 (×3): qty 20

## 2020-08-24 NOTE — Progress Notes (Signed)
  Echocardiogram 2D Echocardiogram has been performed.  Delcie Roch 08/24/2020, 2:50 PM

## 2020-08-24 NOTE — Progress Notes (Signed)
Pharmacy Antibiotic Note  Terry Mathews is a 29 y.o. male admitted on 09/11/2020 with sepsis.  Pharmacy has been consulted for vancomycin dosing.  S/p PEA following Vfib arrest. On day #1 of zosyn and vancomycin. WBC 16.5, LA 4.2>3.2. Temp 103. MRSA PCR positive. Scr 0.9. Vancomycin 1500 mg IV received on 12/13@1049 .  Plan: Start vancomycin 750 mg IV every 8 hours Monitor renal fx, cx results, clinical pic, and VT   Height: 5\' 9"  (175.3 cm) Weight: 69.6 kg (153 lb 7 oz) IBW/kg (Calculated) : 66.2  Temp (24hrs), Avg:96 F (35.6 C), Min:92.5 F (33.6 C), Max:103.82 F (39.9 C)  Recent Labs  Lab 08/13/2020 1658 08/14/2020 1749 08/22/2020 1753 08/12/2020 1933 09/10/2020 2230 08/24/20 0149  WBC  --  10.6*  --   --   --  16.5*  CREATININE 0.70 1.05  --   --   --  0.96  LATICACIDVEN  --   --  >11.0* 7.8* 4.2* 3.2*    Estimated Creatinine Clearance: -4 mL/min (by C-G formula based on SCr of 0.96 mg/dL).    Not on File  Antimicrobials this admission: Vancomycin 12/13 >>  Zosyn 12/13 >>   Dose adjustments this admission: N/A  Microbiology results: 12/12 BCx: ngtd 12/12 Sputum: sent   12/13 MRSA PCR: positive   Thank you for allowing pharmacy to be a part of this patient's care.  1/14, PharmD, BCCCP Clinical Pharmacist  Phone: [phone] 08/24/2020 2:47 PM  Please check AMION for all [facility] Pharmacy phone numbers After 10:00 PM, call Main Pharmacy [phone]

## 2020-08-24 NOTE — Progress Notes (Signed)
eLink Physician-Brief Progress Note Patient Name: Terry Mathews DOB: 09/12/1875 MRN: [id number]   Date of Service  08/24/2020  HPI/Events of Note  Fever to 101.5 F - Nursing request for Tylenol. Not on Antibiotics. Blood cultures drawn yesterday. AST and ALT both elevated. Creatinine = 0.96.   eICU Interventions  Plan: 1. Motrin liquid 200 mg per tube X 1.  2. Tracheal aspirate culture.  3. Zosyn per pharmacy consult for possible aspiration.      Intervention Category Major Interventions: Other:;Infection - evaluation and management  [name] 08/24/2020, 4:49 AM

## 2020-08-24 NOTE — Procedures (Signed)
Heart rate is too high for accurate echo. Please call echo lab if we should proceed regardless of heart rate.

## 2020-08-24 NOTE — Progress Notes (Signed)
EEG completed, results pending. 

## 2020-08-24 NOTE — TOC Initial Note (Signed)
Transition of Care [facility]) - Initial/Assessment Note    Patient Details  Name: Rosine Door MRN: [id number] Date of Birth: 09/12/1875  Transition of Care [facility]) CM/SW Contact:    Curlene Labrum, RN Phone Number: 08/24/2020, 11:17 AM  Clinical Narrative:                 Case management met at the bedside during progression rounds this morning and the primary RN reported that the patient was currently listed as a patient without identification.  I called [employer] at [phone]- and spoke with Records and patient's identification is still undetermined at this time. I called investigative reports for case # [id number] and a police officer has been assigned to this case Water engineer S. Jarrett Soho.  Case management will continue to follow up with the investigator that should be assigned tomorrow to determine if progress has been made regarding patient's identification.  An Psychologist, occupational should be assigned by tomorrow if the patient's identity has not been determined at this point according to police.  Information was left with the officer on the phone is regards that the patient is currently admitted at [facility] along with present room number.  Case management and medical social worker will continue to follow up with Police regarding patient's identity and determination of possible family members.        Patient Goals and CMS Choice        Expected Discharge Plan and Services                                                Prior Living Arrangements/Services                       Activities of Daily Living      Permission Sought/Granted                  Emotional Assessment              Admission diagnosis:  Cardiac arrest [facility]) [I46.9] Patient Active Problem List   Diagnosis Date Noted  . Cardiac arrest ([place]) 08/20/2020   PCP:  No primary care provider on file. Pharmacy:  No Pharmacies Listed    Social  Determinants of Health (SDOH) Interventions    Readmission Risk Interventions No flowsheet data found.

## 2020-08-24 NOTE — Procedures (Signed)
Patient Name: Sumner Boast  MRN: [id number]  Epilepsy Attending: Charlsie Quest  Referring Physician/Provider: Dr. Luciano Cutter Date: 08/24/2020 Duration: 23.26 minutes  Patient history: Unknown age male status post cardiac arrest.  EEG to evaluate for seizures.  Level of alertness: Comatose  AEDs during EEG study: None  Technical aspects: This EEG study was done with scalp electrodes positioned according to the 10-20 International system of electrode placement. Electrical activity was acquired at a sampling rate of 500Hz  and reviewed with a high frequency filter of 70Hz  and a low frequency filter of 1Hz . EEG data were recorded continuously and digitally stored.   Description: EEG showed generalized 2 to 3 Hz delta slowing.  EEG was reactive to noxious stimulation.  Hyperventilation and photic stimulation were not performed.     ABNORMALITY -Continuous slow, generalized  IMPRESSION: This study is suggestive of severe diffuse encephalopathy, nonspecific etiology but could be secondary to sedation, anoxic/hypoxic brain injury.  No seizures or epileptiform discharges were seen throughout the recording.   Mykira Hofmeister 

## 2020-08-24 NOTE — Progress Notes (Signed)
IVT consult: IO LE removed,area covered w/ pressure dressing,4x4 gauze.

## 2020-08-24 NOTE — Progress Notes (Signed)
NAME:  Terry Mathews, MRN:  [id number], DOB:  09/12/1875, LOS: 1 ADMISSION DATE:  08/27/2020, CONSULTATION DATE: 08/21/2020 REFERRING MD: Emergency for physician, CHIEF COMPLAINT: Post PEA and V. fib arrest of approximately 50 minutes  Brief History   Unidentified male found down on [employer].  Noted to be in PEA arrest for unknown period of time  Past Medical History  Unknown  Significant Hospital Events     Consults:  PCCM  Procedures:  08/18/2020 intubation  Significant Diagnostic Tests:  08/25/2020 CT of the head/spine>> There is subtle, diffuse hypodensity of the brain parenchyma, loss of gray-white distinction, sulcal effacement, and pseudohyperdensity and effacement of the basal cisterns. Findings are consistent with diffuse cerebral edema and global anoxic injury. Examination of the cervical spine is limited by motion artifact. No definite evidence of fracture or static subluxation of the cervical spine. There is a motion artifact extending through the base of the odontoid process and C2 vertebral body, not favored to reflect a fracture  Micro Data:  12/12 influenza PCR negative 12/12 Covid PCR negative 12/12 sputum culture >> 12/12 blood cultures negative 12/13 MRSA PCR positive  Antimicrobials:  Zosyn 12/12 >> Vancomycin 12/13 >>  Interim history/subjective:  Patient's MRSA screen is positive.  He does not have most of the brainstem reflexes except triggering the vent  Objective   Blood pressure (!) 145/67, pulse 96, temperature 99.5 F (37.5 C), temperature source Bladder, resp. rate (!) 36, height 5\' 9"  (1.753 m), weight 69.6 kg, SpO2 95 %.    Vent Mode: PRVC FiO2 (%):  [40 %-100 %] 70 % Set Rate:  [22 bmp-30 bmp] 30 bmp Vt Set:  [560 mL] 560 mL PEEP:  [5 cmH20] 5 cmH20 Plateau Pressure:  [16 cmH20-20 cmH20] 20 cmH20   Intake/Output Summary (Last 24 hours) at 08/24/2020 1437 Last data filed at 08/24/2020 1200 Gross per 24 hour  Intake 1515.76 ml   Output 1685 ml  Net -169.24 ml   Filed Weights   09/08/2020 1744 08/24/20 0428  Weight: 70 kg 69.6 kg    Examination:   Physical exam: General: Acutely ill-appearing male, orally intubated HEENT: [place]/AT, eyes anicteric.  ETT and OGT in place Neuro: Eyes closed, pupils mid dilated and fixed, no corneals, weak gag, no cough Chest: Coarse breath sounds, no wheezes or rhonchi Heart: Tachycardic, regular rhythm, no murmurs or gallops Abdomen: Soft, nontender, nondistended, bowel sounds present Skin: No rash  Resolved Hospital Problem list     Assessment & Plan:  Status post PEA cardiac arrest in the setting of polysubstance abuse Acute hypoxic respiratory failure Severe sepsis likely due to aspiration pneumonia Acute hypoxic/toxic encephalopathy High anion gap metabolic acidosis  Still unknown identity of the patient Social worker/palliative care consulted Patient's urine toxicology is positive for benzos and amphetamine Continue lung protective ventilation MRSA screen came back positive, patient is spiking fever and he is tachycardic Continue IV vancomycin and Zosyn He is off sedation with poor mental status CT scan head is consistent with acute hypoxic/anoxic brain injury EEG showed severe encephalopathy, no seizures Echocardiogram is pending He is off bicarbonate infusion Lactic acid is trending down   Poor prognosis  Best practice (evaluated daily)   Diet: NPO Pain/Anxiety/Delirium protocol (if indicated): Not needed VAP protocol (if indicated): In place DVT prophylaxis: SCD GI prophylaxis: PPI Glucose control: Sliding scale insulin Mobility: Bedrest last date of multidisciplinary goals of care discussion unable Family and staff present unable unable Summary of discussion unable Follow up goals  of care discussion: As soon as we find the family Code Status: Full Disposition: Intensive care unit  Labs   CBC: Recent Labs  Lab 09/17/2020 1658 2020/09/17 1739  September 17, 2020 1749 09/17/20 1935 08/24/20 0149  WBC  --   --  10.6*  --  16.5*  NEUTROABS  --   --  3.5  --   --   HGB 10.5* 12.9* 12.1* 13.9 14.9  HCT 31.0* 38.0* 40.5 41.0 44.2  MCV  --   --  102.8*  --  89.3  PLT  --   --  252  --  408*    Basic Metabolic Panel: Recent Labs  Lab 09/17/2020 1658 2020-09-17 1739 09-17-2020 1749 2020-09-17 1750 September 17, 2020 1935 08/24/20 0149  NA 134* 133* 133*  --  136 139  K 4.4 3.3* 4.3  --  3.2* 3.4*  CL 103  --  97*  --   --  97*  CO2  --   --  17*  --   --  26  GLUCOSE 196*  --  266*  --   --  178*  BUN 7*  --  6*  --   --  14  CREATININE 0.70  --  1.05  --   --  0.96  CALCIUM  --   --  10.6*  --   --  8.7*  MG  --   --   --  4.6*  --  2.4   GFR: Estimated Creatinine Clearance: -4 mL/min (by C-G formula based on SCr of 0.96 mg/dL). Recent Labs  Lab 09-17-20 1749 September 17, 2020 1753 2020/09/17 1933 2020-09-17 2230 08/24/20 0149  WBC 10.6*  --   --   --  16.5*  LATICACIDVEN  --  >11.0* 7.8* 4.2* 3.2*    Liver Function Tests: Recent Labs  Lab 09/17/2020 1749  AST 97*  ALT 62*  ALKPHOS 96  BILITOT 0.7  PROT 4.7*  ALBUMIN 2.5*   No results for input(s): LIPASE, AMYLASE in the last 168 hours. No results for input(s): AMMONIA in the last 168 hours.  ABG    Component Value Date/Time   PHART 7.432 09/17/2020 1935   PCO2ART 36.6 2020/09/17 1935   PO2ART 489 (H) 09-17-20 1935   HCO3 25.2 09/17/2020 1935   TCO2 27 2020/09/17 1935   ACIDBASEDEF 14.0 (H) 2020/09/17 1739   O2SAT 100.0 09-17-2020 1935     Coagulation Profile: No results for input(s): INR, PROTIME in the last 168 hours.  Cardiac Enzymes: No results for input(s): CKTOTAL, CKMB, CKMBINDEX, TROPONINI in the last 168 hours.  HbA1C: Hgb A1c MFr Bld  Date/Time Value Ref Range Status  2020/09/17 10:30 PM 4.6 (L) 4.8 - 5.6 % Final    Comment:    (NOTE) Pre diabetes:          5.7%-6.4%  Diabetes:              >6.4%  Glycemic control for   <7.0% adults with diabetes      CBG: Recent Labs  Lab Sep 17, 2020 2110 08/24/20 0013 08/24/20 0406 08/24/20 0755 08/24/20 1138  GLUCAP 247* 212* 123* 117* 86    Review of Systems:   Unable to obtain as patient is intubated  Past Medical History  He,  has no past medical history on file.   Surgical History   Unable to obtain as patient is intubated  Social History     Unable to obtain as patient is intubated Family History   His family  history is not on file.   Allergies Not on File   Home Medications  Prior to Admission medications   Not on File     Total critical care time: 43 minutes  Performed by: Cheri Fowler   Critical care time was exclusive of separately billable procedures and treating other patients.   Critical care was necessary to treat or prevent imminent or life-threatening deterioration.   Critical care was time spent personally by me on the following activities: development of treatment plan with patient and/or surrogate as well as nursing, discussions with consultants, evaluation of patient's response to treatment, examination of patient, obtaining history from patient or surrogate, ordering and performing treatments and interventions, ordering and review of laboratory studies, ordering and review of radiographic studies, pulse oximetry and re-evaluation of patient's condition.   Cheri Fowler MD [facility] Pager: [phone] Mobile: [phone]

## 2020-08-24 NOTE — Progress Notes (Signed)
[facility] referral # [id number]

## 2020-08-24 NOTE — Progress Notes (Signed)
Pharmacy Antibiotic Note  Terry Mathews is a 29 y.o. male admitted on 09/09/2020 with asp pna, fever to 101.5. Pharmacy has been consulted for Zosyn dosing.  Plan: Zosyn 3.375gm IV q8h  Will f/u renal function, micro data, and pt's clinical condition  Height: 5\' 9"  (175.3 cm) Weight: 69.6 kg (153 lb 7 oz) IBW/kg (Calculated) : 66.2  Temp (24hrs), Avg:94.2 F (34.6 C), Min:92.5 F (33.6 C), Max:101.5 F (38.6 C)  Recent Labs  Lab 08/15/2020 1658 08/20/2020 1749 09/03/2020 1753 08/27/2020 1933 08/29/2020 2230 08/24/20 0149  WBC  --  10.6*  --   --   --  16.5*  CREATININE 0.70 1.05  --   --   --  0.96  LATICACIDVEN  --   --  >11.0* 7.8* 4.2* 3.2*    Estimated Creatinine Clearance: -4 mL/min (by C-G formula based on SCr of 0.96 mg/dL).    Not on File  Antimicrobials this admission: 12/13 Zosyn >>   Microbiology results: 12/12 BCx:   Thank you for allowing pharmacy to be a part of this patient's care.  1/14, PharmD, BCPS Please see amion for complete clinical pharmacist phone list 08/24/2020 4:53 AM

## 2020-08-25 DIAGNOSIS — I5021 Acute systolic (congestive) heart failure: Secondary | ICD-10-CM

## 2020-08-25 DIAGNOSIS — E232 Diabetes insipidus: Secondary | ICD-10-CM

## 2020-08-25 DIAGNOSIS — N179 Acute kidney failure, unspecified: Secondary | ICD-10-CM

## 2020-08-25 LAB — MAGNESIUM
Magnesium: 2.5 mg/dL — ABNORMAL HIGH (ref 1.7–2.4)
Magnesium: 2.6 mg/dL — ABNORMAL HIGH (ref 1.7–2.4)

## 2020-08-25 LAB — PHOSPHORUS
Phosphorus: 4.7 mg/dL — ABNORMAL HIGH (ref 2.5–4.6)
Phosphorus: 2 mg/dL — ABNORMAL LOW (ref 2.5–4.6)

## 2020-08-25 LAB — GLUCOSE, CAPILLARY
Glucose-Capillary: 112 mg/dL — ABNORMAL HIGH (ref 70–99)
Glucose-Capillary: 116 mg/dL — ABNORMAL HIGH (ref 70–99)
Glucose-Capillary: 124 mg/dL — ABNORMAL HIGH (ref 70–99)
Glucose-Capillary: 126 mg/dL — ABNORMAL HIGH (ref 70–99)
Glucose-Capillary: 82 mg/dL (ref 70–99)
Glucose-Capillary: 96 mg/dL (ref 70–99)

## 2020-08-25 MED ORDER — POLYVINYL ALCOHOL 1.4 % OP SOLN
1.0000 [drp] | OPHTHALMIC | Status: DC | PRN
  Administered 2020-08-27: 04:00:00 1 [drp] via OPHTHALMIC
  Filled 2020-08-25 (×2): qty 15

## 2020-08-25 MED ORDER — VITAL AF 1.2 CAL PO LIQD
1000.0000 mL | ORAL | Status: DC
  Administered 2020-08-25 – 2020-08-26 (×3): 1000 mL

## 2020-08-25 MED ORDER — DESMOPRESSIN ACETATE 4 MCG/ML IJ SOLN
2.0000 ug | Freq: Once | INTRAMUSCULAR | Status: AC
  Administered 2020-08-25: 12:00:00 2 ug via INTRAVENOUS
  Filled 2020-08-25 (×2): qty 1

## 2020-08-25 MED ORDER — POTASSIUM CHLORIDE 10 MEQ/50ML IV SOLN
10.0000 meq | INTRAVENOUS | Status: AC
Start: 2020-08-25 — End: 2020-08-25
  Administered 2020-08-25 (×4): 10 meq via INTRAVENOUS
  Filled 2020-08-25 (×2): qty 50

## 2020-08-25 MED ORDER — POTASSIUM CHLORIDE 20 MEQ PO PACK
20.0000 meq | PACK | ORAL | Status: AC
  Administered 2020-08-25 (×2): 20 meq
  Filled 2020-08-25 (×2): qty 1

## 2020-08-25 MED ORDER — LACTATED RINGERS IV BOLUS
1000.0000 mL | Freq: Once | INTRAVENOUS | Status: AC
  Administered 2020-08-25: 10:00:00 1000 mL via INTRAVENOUS

## 2020-08-25 MED ORDER — SODIUM CHLORIDE 0.9 % IV SOLN: INTRAVENOUS | Status: DC | PRN

## 2020-08-25 MED ORDER — SODIUM CHLORIDE 0.9 % IV SOLN
2.0000 g | Freq: Three times a day (TID) | INTRAVENOUS | Status: DC
  Administered 2020-08-25 – 2020-08-28 (×10): 2 g via INTRAVENOUS
  Filled 2020-08-25 (×10): qty 2

## 2020-08-25 NOTE — Progress Notes (Signed)
NAME:  Terry Mathews, MRN:  [id number], DOB:  09/12/1875, LOS: 2 ADMISSION DATE:  09/02/2020, CONSULTATION DATE: 08/14/2020 REFERRING MD: Emergency for physician, CHIEF COMPLAINT: Post PEA and V. fib arrest of approximately 50 minutes  Brief History   Unidentified male found down on [employer].  Noted to be in PEA arrest for unknown period of time  Past Medical History  Unknown  Significant Hospital Events     Consults:  PCCM  Procedures:  08/12/2020 intubation  Significant Diagnostic Tests:  08/24/2020 CT of the head/spine>> There is subtle, diffuse hypodensity of the brain parenchyma, loss of gray-white distinction, sulcal effacement, and pseudohyperdensity and effacement of the basal cisterns. Findings are consistent with diffuse cerebral edema and global anoxic injury. Examination of the cervical spine is limited by motion artifact. No definite evidence of fracture or static subluxation of the cervical spine. There is a motion artifact extending through the base of the odontoid process and C2 vertebral body, not favored to reflect a fracture  Micro Data:  12/12 influenza PCR negative 12/12 Covid PCR negative 12/12 sputum culture >> 12/12 blood cultures negative 12/13 MRSA PCR positive  Antimicrobials:  Zosyn 12/12 >> 12/14 Vancomycin 12/13 >> Cefepime 12/14 >  Interim history/subjective:  Patient is started putting out a lot of urine and his serum creatinine started trending up.  Has unequal pupil right larger than left, bilateral fixed  Objective   Blood pressure 115/78, pulse (!) 104, temperature 99.1 F (37.3 C), temperature source Core, resp. rate 20, height 5\' 9"  (1.753 m), weight 70.4 kg, SpO2 93 %.    Vent Mode: PRVC FiO2 (%):  [40 %-100 %] 60 % Set Rate:  [30 bmp] 30 bmp Vt Set:  [560 mL] 560 mL PEEP:  [5 cmH20] 5 cmH20 Plateau Pressure:  [20 cmH20-22 cmH20] 22 cmH20   Intake/Output Summary (Last 24 hours) at 08/25/2020 1118 Last data filed at  08/25/2020 1100 Gross per 24 hour  Intake 2186.94 ml  Output 3350 ml  Net -1163.06 ml   Filed Weights   08/25/2020 1744 08/24/20 0428 08/25/20 0500  Weight: 70 kg 69.6 kg 70.4 kg    Examination:   Physical exam: General: Acutely ill-appearing male, orally intubated HEENT: [place]/AT, eyes anicteric.  ETT and OGT in place Neuro: Eyes closed, pupils right 6 mm, fixed left 3 mm, fixed, no corneals, weak cough and gag, extensor posturing on upper extremities no movement on lower extremity with noxious stimuli Chest: Coarse breath sounds, no wheezes or rhonchi Heart: Tachycardic, regular rhythm, no murmurs or gallops Abdomen: Soft, nontender, nondistended, bowel sounds present Skin: No rash, extensive tattoo marking  Resolved Hospital Problem list     Assessment & Plan:  Status post PEA cardiac arrest in the setting of polysubstance abuse Acute hypoxic respiratory failure Severe sepsis likely due to aspiration pneumonia Acute hypoxic/toxic encephalopathy High anion gap metabolic acidosis Acute kidney injury due to severe dehydration caused by DI Central DI Findings of brain herniation Acute systolic congestive heart failure  Still unknown identity of the patient Social worker/palliative care consulted Patient's urine toxicology is positive for benzos and amphetamine Continue lung protective ventilation Continue vancomycin and cefepime, Zosyn was stopped due to AKI Patient has extensive posturing on bilateral upper extremities with weak cough and gag Initial CT scan head is consistent with acute hypoxic/anoxic brain injury EEG showed severe encephalopathy, no seizures Echocardiogram showed EF of 20 to 25% with severe wall motion abnormalities and hypokinesis Patient serum creatinine is trending up, likely  large urine output from DI We will give him 1 L of LR DDAVP was given for DI [facility] was notified  Poor prognosis  Best practice (evaluated daily)   Diet:  NPO Pain/Anxiety/Delirium protocol (if indicated): NA VAP protocol (if indicated): In place DVT prophylaxis: SCD GI prophylaxis: PPI Glucose control: Sliding scale insulin Mobility: Bedrest last date of multidisciplinary goals of care discussion unable to find family Family and staff present unable unable Summary of discussion unable Follow up goals of care discussion: As soon as we find the family Code Status: Full Disposition: Intensive care unit  Labs   CBC: Recent Labs  Lab 08/17/2020 1658 08/17/2020 1739 08/22/2020 1749 08/12/2020 1935 08/24/20 0149  WBC  --   --  10.6*  --  16.5*  NEUTROABS  --   --  3.5  --   --   HGB 10.5* 12.9* 12.1* 13.9 14.9  HCT 31.0* 38.0* 40.5 41.0 44.2  MCV  --   --  102.8*  --  89.3  PLT  --   --  252  --  408*    Basic Metabolic Panel: Recent Labs  Lab 09/08/2020 1658 08/21/2020 1739 09/07/2020 1749 08/25/2020 1750 08/22/2020 1935 08/24/20 0149 08/24/20 1543 08/25/20 0320  NA 134* 133* 133*  --  136 139  --  151*  K 4.4 3.3* 4.3  --  3.2* 3.4*  --  3.2*  CL 103  --  97*  --   --  97*  --  101  CO2  --   --  17*  --   --  26  --  30  GLUCOSE 196*  --  266*  --   --  178*  --  85  BUN 7*  --  6*  --   --  14  --  21  CREATININE 0.70  --  1.05  --   --  0.96  --  1.23  CALCIUM  --   --  10.6*  --   --  8.7*  --  8.2*  MG  --   --   --  4.6*  --  2.4 2.1 2.5*  PHOS  --   --   --   --   --   --  4.2 4.7*   GFR: Estimated Creatinine Clearance: -3.2 mL/min (by C-G formula based on SCr of 1.23 mg/dL). Recent Labs  Lab 08/25/2020 1749 08/25/2020 1753 08/18/2020 1933 09/09/2020 2230 08/24/20 0149  WBC 10.6*  --   --   --  16.5*  LATICACIDVEN  --  >11.0* 7.8* 4.2* 3.2*    Liver Function Tests: Recent Labs  Lab 08/25/2020 1749  AST 97*  ALT 62*  ALKPHOS 96  BILITOT 0.7  PROT 4.7*  ALBUMIN 2.5*   No results for input(s): LIPASE, AMYLASE in the last 168 hours. No results for input(s): AMMONIA in the last 168 hours.  ABG    Component Value  Date/Time   PHART 7.432 09/02/2020 1935   PCO2ART 36.6 08/17/2020 1935   PO2ART 489 (H) 08/30/2020 1935   HCO3 25.2 08/27/2020 1935   TCO2 27 08/27/2020 1935   ACIDBASEDEF 14.0 (H) 08/29/2020 1739   O2SAT 100.0 08/25/2020 1935     Coagulation Profile: No results for input(s): INR, PROTIME in the last 168 hours.  Cardiac Enzymes: No results for input(s): CKTOTAL, CKMB, CKMBINDEX, TROPONINI in the last 168 hours.  HbA1C: Hgb A1c MFr Bld  Date/Time Value Ref Range Status  08/27/2020 10:30  PM 4.6 (L) 4.8 - 5.6 % Final    Comment:    (NOTE) Pre diabetes:          5.7%-6.4%  Diabetes:              >6.4%  Glycemic control for   <7.0% adults with diabetes     CBG: Recent Labs  Lab 08/24/20 1533 08/24/20 1927 08/24/20 2353 08/25/20 0315 08/25/20 0822  GLUCAP 83 91 70 82 96    Review of Systems:   Unable to obtain as patient is intubated  Past Medical History  He,  has no past medical history on file.   Surgical History   Unable to obtain as patient is intubated  Social History     Unable to obtain as patient is intubated Family History   His family history is not on file.   Allergies Not on File   Home Medications  Prior to Admission medications   Not on File     Total critical care time: 40 minutes  Performed by: Cheri Fowler   Critical care time was exclusive of separately billable procedures and treating other patients.   Critical care was necessary to treat or prevent imminent or life-threatening deterioration.   Critical care was time spent personally by me on the following activities: development of treatment plan with patient and/or surrogate as well as nursing, discussions with consultants, evaluation of patient's response to treatment, examination of patient, obtaining history from patient or surrogate, ordering and performing treatments and interventions, ordering and review of laboratory studies, ordering and review of radiographic studies,  pulse oximetry and re-evaluation of patient's condition.   Cheri Fowler MD [facility] Pager: [phone] Mobile: [phone]

## 2020-08-25 NOTE — TOC Progression Note (Addendum)
Transition of Care [facility]) - Progression Note    Patient Details  Name: Terry Mathews MRN: [id number] Date of Birth: 09/12/1875  Transition of Care [facility]) CM/SW Contact  Janae Bridgeman, RN Phone Number: 08/25/2020, 11:19 AM  Clinical Narrative:    Case management called the [facility] Department at [phone] and left a message with case number [id number] describing the patient's critical medical condition and need for identification and assistance from police department to determine this along with any possible family contacts.  I left my contact number and location of the patient at this time concerning this urgent matter.  Alfredo Bach, MSW will follow up with the [facility] police department to help expedite identification of the patient as well.  No response from [facility] police department at this time.    08/25/2020 1400 - [place] police returned my call and the patient has been assigned an investigating sargent -- Evlyn Kanner [phone] and Corporal Palmenteri [phone].  Alfredo Bach, MSW was notified and she is calling now to speak with them regarding the patient's need for identity and determination of related family.  Will continue to follow for identification of the patient and TOC needs for care.         Expected Discharge Plan and Services                                                 Social Determinants of Health (SDOH) Interventions    Readmission Risk Interventions No flowsheet data found.

## 2020-08-25 NOTE — Progress Notes (Signed)
Initial Nutrition Assessment  DOCUMENTATION CODES:   Not applicable  INTERVENTION:   Tube Feeding via OG:  Vital AF 1.2 at 65 ml/hr Provides 117 g of protein, 1872 kcals and 1264 mL of free water Meets 100% estimated calorie and protein needs   NUTRITION DIAGNOSIS:   Inadequate oral intake related to acute illness as evidenced by NPO status.  GOAL:   Patient will meet greater than or equal to 90% of their needs  MONITOR:   Vent status,TF tolerance,Weight trends,Labs  REASON FOR ASSESSMENT:   (P) Consult    ASSESSMENT:   Male Terry Mathews admitted s/p PEA arrest in setting of polysubtance abuse  after being found unresponsive at [employer]  12/12 Intubated  Noted poor prognosis, acute anoxic brain injury per CT head. EEG with severe encephalopathy  Patient is currently intubated on ventilator support MV: 17.6 L/min Temp (24hrs), Avg:99 F (37.2 C), Min:96.8 F (36 C), Max:100.4 F (38 C)  Propofol: none  OG tube tip in stomach per abd xray  Tolerating Vital High Protein at 40 ml/hr via OG tube   Unable to obtain diet and weight history  Current wt 70.4 kg  Labs: sodium 151 (H), potassium 3.2 (L) Meds: ss novolog   Diet Order:   Diet Order            Diet NPO time specified  Diet effective now                 EDUCATION NEEDS:   Not appropriate for education at this time  Skin:  Skin Assessment: Reviewed RN Assessment  Last BM:  12/14 rectal tube  Height:   Ht Readings from Last 1 Encounters:  09/09/2020 5\' 9"  (1.753 m)    Weight:   Wt Readings from Last 1 Encounters:  08/25/20 70.4 kg   BMI:  Body mass index is 22.92 kg/m.  Estimated Nutritional Needs:   Kcal:  1750-2100 kcals  Protein:  90-115 g  Fluid:  >/= 2 L    08/27/20 MS, RDN, LDN, CNSC Registered Dietitian III Clinical Nutrition RD Pager and On-Call Pager Number Located in [place]

## 2020-08-25 NOTE — TOC Progression Note (Addendum)
Transition of Care [facility]) - Progression Note    Patient Details  Name: Sumner Boast MRN: [id number] Date of Birth: 09/12/1875  Transition of Care [facility]) CM/SW Contact  [name], LCSWA Phone Number: 08/25/2020, 11:26 AM  Clinical Narrative:     CSW received call from detective Balambao she and CSI are on their way to the hospital to take prints and pictures of patient. CSW awaiting call for when they arrive.  CSW will continue to follow.  CSW called [employer] at [phone] and left a message with case number [id number]. CSW will provide an update once received.   CSW will continue to follow.         Expected Discharge Plan and Services                                                 Social Determinants of Health (SDOH) Interventions    Readmission Risk Interventions No flowsheet data found.

## 2020-08-25 NOTE — Plan of Care (Signed)
?  Problem: Clinical Measurements: ?Goal: Will remain free from infection ?Outcome: Progressing ?  ?

## 2020-08-25 NOTE — Progress Notes (Signed)
Palliative Medicine RN Note: Consult order noted.   Per Lorinda Creed, PMT NP, our team does not have a role as long as this patient remains unidentified with no family/contacts. If he is identified and family is located, please CALL OUR OFFICE at [phone].  Margret Chance Xaden Kaufman, RN, BSN, [facility] Palliative Medicine Team 08/25/2020 2:11 PM Office [phone]

## 2020-08-25 NOTE — Progress Notes (Addendum)
[place] notified of change in pupils and addition of DDAVP.

## 2020-08-26 ENCOUNTER — Inpatient Hospital Stay (HOSPITAL_COMMUNITY): Payer: Medicaid Other

## 2020-08-26 DIAGNOSIS — G9382 Brain death: Secondary | ICD-10-CM

## 2020-08-26 DIAGNOSIS — I469 Cardiac arrest, cause unspecified: Secondary | ICD-10-CM

## 2020-08-26 DIAGNOSIS — Z5289 Donor of other specified organs or tissues: Secondary | ICD-10-CM

## 2020-08-26 DIAGNOSIS — Z529 Donor of unspecified organ or tissue: Secondary | ICD-10-CM

## 2020-08-26 DIAGNOSIS — Z515 Encounter for palliative care: Secondary | ICD-10-CM

## 2020-08-26 LAB — POCT I-STAT 7, (LYTES, BLD GAS, ICA,H+H)
Acid-Base Excess: 6 mmol/L — ABNORMAL HIGH (ref 0.0–2.0)
Acid-Base Excess: 7 mmol/L — ABNORMAL HIGH (ref 0.0–2.0)
Acid-Base Excess: 9 mmol/L — ABNORMAL HIGH (ref 0.0–2.0)
Acid-Base Excess: 5 mmol/L — ABNORMAL HIGH (ref 0.0–2.0)
Acid-Base Excess: 3 mmol/L — ABNORMAL HIGH (ref 0.0–2.0)
Acid-Base Excess: 2 mmol/L (ref 0.0–2.0)
Bicarbonate: 28.2 mmol/L — ABNORMAL HIGH (ref 20.0–28.0)
Bicarbonate: 32.4 mmol/L — ABNORMAL HIGH (ref 20.0–28.0)
Bicarbonate: 35.2 mmol/L — ABNORMAL HIGH (ref 20.0–28.0)
Bicarbonate: 30.8 mmol/L — ABNORMAL HIGH (ref 20.0–28.0)
Bicarbonate: 28.9 mmol/L — ABNORMAL HIGH (ref 20.0–28.0)
Bicarbonate: 29.6 mmol/L — ABNORMAL HIGH (ref 20.0–28.0)
Calcium, Ion: 1.12 mmol/L — ABNORMAL LOW (ref 1.15–1.40)
Calcium, Ion: 1.14 mmol/L — ABNORMAL LOW (ref 1.15–1.40)
Calcium, Ion: 1.18 mmol/L (ref 1.15–1.40)
Calcium, Ion: 1.16 mmol/L (ref 1.15–1.40)
Calcium, Ion: 1.16 mmol/L (ref 1.15–1.40)
Calcium, Ion: 1.2 mmol/L (ref 1.15–1.40)
HCT: 31 % — ABNORMAL LOW (ref 39.0–52.0)
HCT: 33 % — ABNORMAL LOW (ref 39.0–52.0)
HCT: 35 % — ABNORMAL LOW (ref 39.0–52.0)
HCT: 33 % — ABNORMAL LOW (ref 39.0–52.0)
HCT: 31 % — ABNORMAL LOW (ref 39.0–52.0)
HCT: 32 % — ABNORMAL LOW (ref 39.0–52.0)
Hemoglobin: 10.5 g/dL — ABNORMAL LOW (ref 13.0–17.0)
Hemoglobin: 11.2 g/dL — ABNORMAL LOW (ref 13.0–17.0)
Hemoglobin: 11.9 g/dL — ABNORMAL LOW (ref 13.0–17.0)
Hemoglobin: 11.2 g/dL — ABNORMAL LOW (ref 13.0–17.0)
Hemoglobin: 10.5 g/dL — ABNORMAL LOW (ref 13.0–17.0)
Hemoglobin: 10.9 g/dL — ABNORMAL LOW (ref 13.0–17.0)
O2 Saturation: 100 %
O2 Saturation: 100 %
O2 Saturation: 97 %
O2 Saturation: 100 %
O2 Saturation: 93 %
O2 Saturation: 100 %
Patient temperature: 37
Patient temperature: 37.1
Patient temperature: 98.6
Patient temperature: 37.1
Patient temperature: 35.7
Patient temperature: 36
Potassium: 3 mmol/L — ABNORMAL LOW (ref 3.5–5.1)
Potassium: 3.1 mmol/L — ABNORMAL LOW (ref 3.5–5.1)
Potassium: 3.3 mmol/L — ABNORMAL LOW (ref 3.5–5.1)
Potassium: 3.3 mmol/L — ABNORMAL LOW (ref 3.5–5.1)
Potassium: 3.6 mmol/L (ref 3.5–5.1)
Potassium: 3.7 mmol/L (ref 3.5–5.1)
Sodium: 154 mmol/L — ABNORMAL HIGH (ref 135–145)
Sodium: 155 mmol/L — ABNORMAL HIGH (ref 135–145)
Sodium: 155 mmol/L — ABNORMAL HIGH (ref 135–145)
Sodium: 158 mmol/L — ABNORMAL HIGH (ref 135–145)
Sodium: 157 mmol/L — ABNORMAL HIGH (ref 135–145)
Sodium: 157 mmol/L — ABNORMAL HIGH (ref 135–145)
TCO2: 29 mmol/L (ref 22–32)
TCO2: 34 mmol/L — ABNORMAL HIGH (ref 22–32)
TCO2: 38 mmol/L — ABNORMAL HIGH (ref 22–32)
TCO2: 32 mmol/L (ref 22–32)
TCO2: 30 mmol/L (ref 22–32)
TCO2: 32 mmol/L (ref 22–32)
pCO2 arterial: 29.9 mmHg — ABNORMAL LOW (ref 32.0–48.0)
pCO2 arterial: 38.4 mmHg (ref 32.0–48.0)
pCO2 arterial: 78.9 mmHg (ref 32.0–48.0)
pCO2 arterial: 51.7 mmHg — ABNORMAL HIGH (ref 32.0–48.0)
pCO2 arterial: 46.6 mmHg (ref 32.0–48.0)
pCO2 arterial: 61.2 mmHg — ABNORMAL HIGH (ref 32.0–48.0)
pH, Arterial: 7.258 — ABNORMAL LOW (ref 7.350–7.450)
pH, Arterial: 7.535 — ABNORMAL HIGH (ref 7.350–7.450)
pH, Arterial: 7.583 — ABNORMAL HIGH (ref 7.350–7.450)
pH, Arterial: 7.383 (ref 7.350–7.450)
pH, Arterial: 7.395 (ref 7.350–7.450)
pH, Arterial: 7.288 — ABNORMAL LOW (ref 7.350–7.450)
pO2, Arterial: 309 mmHg — ABNORMAL HIGH (ref 83.0–108.0)
pO2, Arterial: 363 mmHg — ABNORMAL HIGH (ref 83.0–108.0)
pO2, Arterial: 79 mmHg — ABNORMAL LOW (ref 83.0–108.0)
pO2, Arterial: 391 mmHg — ABNORMAL HIGH (ref 83.0–108.0)
pO2, Arterial: 63 mmHg — ABNORMAL LOW (ref 83.0–108.0)
pO2, Arterial: 214 mmHg — ABNORMAL HIGH (ref 83.0–108.0)

## 2020-08-26 LAB — CBC
HCT: 37.6 % — ABNORMAL LOW (ref 39.0–52.0)
Hemoglobin: 12.1 g/dL — ABNORMAL LOW (ref 13.0–17.0)
MCH: 29.9 pg (ref 26.0–34.0)
MCHC: 32.2 g/dL (ref 30.0–36.0)
MCV: 92.8 fL (ref 80.0–100.0)
Platelets: 288 10*3/uL (ref 150–400)
RBC: 4.05 MIL/uL — ABNORMAL LOW (ref 4.22–5.81)
RDW: 13.5 % (ref 11.5–15.5)
WBC: 23.1 10*3/uL — ABNORMAL HIGH (ref 4.0–10.5)
nRBC: 0 % (ref 0.0–0.2)

## 2020-08-26 LAB — CBC WITH DIFFERENTIAL/PLATELET
Abs Immature Granulocytes: 0.3 10*3/uL — ABNORMAL HIGH (ref 0.00–0.07)
Abs Immature Granulocytes: 0.1 10*3/uL — ABNORMAL HIGH (ref 0.00–0.07)
Basophils Absolute: 0.1 10*3/uL (ref 0.0–0.1)
Basophils Absolute: 0.1 10*3/uL (ref 0.0–0.1)
Basophils Relative: 0 %
Basophils Relative: 0 %
Eosinophils Absolute: 0.3 10*3/uL (ref 0.0–0.5)
Eosinophils Absolute: 0.3 10*3/uL (ref 0.0–0.5)
Eosinophils Relative: 2 %
Eosinophils Relative: 2 %
HCT: 37.6 % — ABNORMAL LOW (ref 39.0–52.0)
HCT: 36.9 % — ABNORMAL LOW (ref 39.0–52.0)
Hemoglobin: 11.8 g/dL — ABNORMAL LOW (ref 13.0–17.0)
Hemoglobin: 11.4 g/dL — ABNORMAL LOW (ref 13.0–17.0)
Immature Granulocytes: 2 %
Immature Granulocytes: 1 %
Lymphocytes Relative: 11 %
Lymphocytes Relative: 13 %
Lymphs Abs: 2 10*3/uL (ref 0.7–4.0)
Lymphs Abs: 2.1 10*3/uL (ref 0.7–4.0)
MCH: 29.9 pg (ref 26.0–34.0)
MCH: 30.1 pg (ref 26.0–34.0)
MCHC: 31.4 g/dL (ref 30.0–36.0)
MCHC: 30.9 g/dL (ref 30.0–36.0)
MCV: 95.2 fL (ref 80.0–100.0)
MCV: 97.4 fL (ref 80.0–100.0)
Monocytes Absolute: 0.6 10*3/uL (ref 0.1–1.0)
Monocytes Absolute: 0.5 10*3/uL (ref 0.1–1.0)
Monocytes Relative: 3 %
Monocytes Relative: 3 %
Neutro Abs: 15 10*3/uL — ABNORMAL HIGH (ref 1.7–7.7)
Neutro Abs: 12.6 10*3/uL — ABNORMAL HIGH (ref 1.7–7.7)
Neutrophils Relative %: 82 %
Neutrophils Relative %: 81 %
Platelets: 240 10*3/uL (ref 150–400)
Platelets: 232 10*3/uL (ref 150–400)
RBC: 3.95 MIL/uL — ABNORMAL LOW (ref 4.22–5.81)
RBC: 3.79 MIL/uL — ABNORMAL LOW (ref 4.22–5.81)
RDW: 14 % (ref 11.5–15.5)
RDW: 14.2 % (ref 11.5–15.5)
WBC: 18.2 10*3/uL — ABNORMAL HIGH (ref 4.0–10.5)
WBC: 15.8 10*3/uL — ABNORMAL HIGH (ref 4.0–10.5)
nRBC: 0 % (ref 0.0–0.2)
nRBC: 0 % (ref 0.0–0.2)

## 2020-08-26 LAB — HEPATIC FUNCTION PANEL
ALT: 61 U/L — ABNORMAL HIGH (ref 0–44)
ALT: 56 U/L — ABNORMAL HIGH (ref 0–44)
AST: 33 U/L (ref 15–41)
AST: 29 U/L (ref 15–41)
Albumin: 2 g/dL — ABNORMAL LOW (ref 3.5–5.0)
Albumin: 1.9 g/dL — ABNORMAL LOW (ref 3.5–5.0)
Alkaline Phosphatase: 134 U/L — ABNORMAL HIGH (ref 38–126)
Alkaline Phosphatase: 95 U/L (ref 38–126)
Bilirubin, Direct: <0.1 mg/dL (ref 0.0–0.2)
Bilirubin, Direct: <0.1 mg/dL (ref 0.0–0.2)
Total Bilirubin: 0.7 mg/dL (ref 0.3–1.2)
Total Bilirubin: 0.6 mg/dL (ref 0.3–1.2)
Total Protein: 5.6 g/dL — ABNORMAL LOW (ref 6.5–8.1)
Total Protein: 5.1 g/dL — ABNORMAL LOW (ref 6.5–8.1)

## 2020-08-26 LAB — COMPREHENSIVE METABOLIC PANEL
ALT: 62 U/L — ABNORMAL HIGH (ref 0–44)
AST: 97 U/L — ABNORMAL HIGH (ref 15–41)
Albumin: 2.5 g/dL — ABNORMAL LOW (ref 3.5–5.0)
Alkaline Phosphatase: 96 U/L (ref 38–126)
Anion gap: 19 — ABNORMAL HIGH (ref 5–15)
BUN: 6 mg/dL (ref 6–20)
CO2: 17 mmol/L — ABNORMAL LOW (ref 22–32)
Calcium: 10.6 mg/dL — ABNORMAL HIGH (ref 8.9–10.3)
Chloride: 97 mmol/L — ABNORMAL LOW (ref 98–111)
Creatinine, Ser: 1.05 mg/dL (ref 0.61–1.24)
Glucose, Bld: 266 mg/dL — ABNORMAL HIGH (ref 70–99)
Potassium: 4.3 mmol/L (ref 3.5–5.1)
Sodium: 133 mmol/L — ABNORMAL LOW (ref 135–145)
Total Bilirubin: 0.7 mg/dL (ref 0.3–1.2)
Total Protein: 4.7 g/dL — ABNORMAL LOW (ref 6.5–8.1)

## 2020-08-26 LAB — BASIC METABOLIC PANEL
Anion gap: 16 — ABNORMAL HIGH (ref 5–15)
Anion gap: 20 — ABNORMAL HIGH (ref 5–15)
Anion gap: 12 (ref 5–15)
Anion gap: 9 (ref 5–15)
Anion gap: 9 (ref 5–15)
Anion gap: 9 (ref 5–15)
BUN: 14 mg/dL (ref 6–20)
BUN: 21 mg/dL — ABNORMAL HIGH (ref 6–20)
BUN: 40 mg/dL — ABNORMAL HIGH (ref 6–20)
BUN: 41 mg/dL — ABNORMAL HIGH (ref 6–20)
BUN: 41 mg/dL — ABNORMAL HIGH (ref 6–20)
BUN: 35 mg/dL — ABNORMAL HIGH (ref 6–20)
CO2: 26 mmol/L (ref 22–32)
CO2: 30 mmol/L (ref 22–32)
CO2: 30 mmol/L (ref 22–32)
CO2: 30 mmol/L (ref 22–32)
CO2: 26 mmol/L (ref 22–32)
CO2: 27 mmol/L (ref 22–32)
Calcium: 8.7 mg/dL — ABNORMAL LOW (ref 8.9–10.3)
Calcium: 8.2 mg/dL — ABNORMAL LOW (ref 8.9–10.3)
Calcium: 8.1 mg/dL — ABNORMAL LOW (ref 8.9–10.3)
Calcium: 8.1 mg/dL — ABNORMAL LOW (ref 8.9–10.3)
Calcium: 7.8 mg/dL — ABNORMAL LOW (ref 8.9–10.3)
Calcium: 7.9 mg/dL — ABNORMAL LOW (ref 8.9–10.3)
Chloride: 97 mmol/L — ABNORMAL LOW (ref 98–111)
Chloride: 101 mmol/L (ref 98–111)
Chloride: 111 mmol/L (ref 98–111)
Chloride: 116 mmol/L — ABNORMAL HIGH (ref 98–111)
Chloride: 119 mmol/L — ABNORMAL HIGH (ref 98–111)
Chloride: 120 mmol/L — ABNORMAL HIGH (ref 98–111)
Creatinine, Ser: 0.96 mg/dL (ref 0.61–1.24)
Creatinine, Ser: 1.23 mg/dL (ref 0.61–1.24)
Creatinine, Ser: 1.25 mg/dL — ABNORMAL HIGH (ref 0.61–1.24)
Creatinine, Ser: 1.19 mg/dL (ref 0.61–1.24)
Creatinine, Ser: 1.1 mg/dL (ref 0.61–1.24)
Creatinine, Ser: 1.01 mg/dL (ref 0.61–1.24)
GFR, Estimated: 54 mL/min — ABNORMAL LOW (ref >60)
GFR, Estimated: 40 mL/min — ABNORMAL LOW (ref >60)
GFR, Estimated: 39 mL/min — ABNORMAL LOW (ref >60)
GFR, Estimated: >60 mL/min (ref >60)
GFR, Estimated: >60 mL/min (ref >60)
GFR, Estimated: >60 mL/min (ref >60)
Glucose, Bld: 178 mg/dL — ABNORMAL HIGH (ref 70–99)
Glucose, Bld: 85 mg/dL (ref 70–99)
Glucose, Bld: 120 mg/dL — ABNORMAL HIGH (ref 70–99)
Glucose, Bld: 121 mg/dL — ABNORMAL HIGH (ref 70–99)
Glucose, Bld: 126 mg/dL — ABNORMAL HIGH (ref 70–99)
Glucose, Bld: 94 mg/dL (ref 70–99)
Potassium: 3.4 mmol/L — ABNORMAL LOW (ref 3.5–5.1)
Potassium: 3.2 mmol/L — ABNORMAL LOW (ref 3.5–5.1)
Potassium: 2.6 mmol/L — CL (ref 3.5–5.1)
Potassium: 3.1 mmol/L — ABNORMAL LOW (ref 3.5–5.1)
Potassium: 3.3 mmol/L — ABNORMAL LOW (ref 3.5–5.1)
Potassium: 3.7 mmol/L (ref 3.5–5.1)
Sodium: 139 mmol/L (ref 135–145)
Sodium: 151 mmol/L — ABNORMAL HIGH (ref 135–145)
Sodium: 153 mmol/L — ABNORMAL HIGH (ref 135–145)
Sodium: 155 mmol/L — ABNORMAL HIGH (ref 135–145)
Sodium: 154 mmol/L — ABNORMAL HIGH (ref 135–145)
Sodium: 156 mmol/L — ABNORMAL HIGH (ref 135–145)

## 2020-08-26 LAB — CK TOTAL AND CKMB (NOT AT ARMC)
CK, MB: 6.4 ng/mL — ABNORMAL HIGH (ref 0.5–5.0)
Relative Index: INVALID (ref 0.0–2.5)
Total CK: 89 U/L (ref 49–397)

## 2020-08-26 LAB — ABO/RH
ABO/RH(D): A POS
PT AG Type: POSITIVE

## 2020-08-26 LAB — RESP PANEL BY RT-PCR (FLU A&B, COVID) ARPGX2
Influenza A by PCR: NEGATIVE
Influenza B by PCR: NEGATIVE
SARS Coronavirus 2 by RT PCR: NEGATIVE

## 2020-08-26 LAB — MAGNESIUM
Magnesium: 2.7 mg/dL — ABNORMAL HIGH (ref 1.7–2.4)
Magnesium: 2.7 mg/dL — ABNORMAL HIGH (ref 1.7–2.4)
Magnesium: 2.5 mg/dL — ABNORMAL HIGH (ref 1.7–2.4)

## 2020-08-26 LAB — FIBRINOGEN: Fibrinogen: >800 mg/dL — ABNORMAL HIGH (ref 210–475)

## 2020-08-26 LAB — URINALYSIS, ROUTINE W REFLEX MICROSCOPIC
Bilirubin Urine: NEGATIVE
Glucose, UA: NEGATIVE mg/dL
Ketones, ur: NEGATIVE mg/dL
Leukocytes,Ua: NEGATIVE
Nitrite: NEGATIVE
Protein, ur: 30 mg/dL — AB
Specific Gravity, Urine: 1.03 (ref 1.005–1.030)
pH: 5 (ref 5.0–8.0)

## 2020-08-26 LAB — PHOSPHORUS
Phosphorus: 2 mg/dL — ABNORMAL LOW (ref 2.5–4.6)
Phosphorus: 4.7 mg/dL — ABNORMAL HIGH (ref 2.5–4.6)
Phosphorus: 4.1 mg/dL (ref 2.5–4.6)
Phosphorus: 4.2 mg/dL (ref 2.5–4.6)

## 2020-08-26 LAB — LIPASE, BLOOD: Lipase: 17 U/L (ref 11–51)

## 2020-08-26 LAB — TYPE AND SCREEN
ABO/RH(D): A POS
Antibody Screen: NEGATIVE
PT AG Type: POSITIVE

## 2020-08-26 LAB — POCT I-STAT, CHEM 8
BUN: 7 mg/dL (ref 6–20)
Calcium, Ion: 1.46 mmol/L — ABNORMAL HIGH (ref 1.15–1.40)
Chloride: 103 mmol/L (ref 98–111)
Creatinine, Ser: 0.7 mg/dL (ref 0.61–1.24)
Glucose, Bld: 196 mg/dL — ABNORMAL HIGH (ref 70–99)
HCT: 31 % — ABNORMAL LOW (ref 39.0–52.0)
Hemoglobin: 10.5 g/dL — ABNORMAL LOW (ref 13.0–17.0)
Potassium: 4.4 mmol/L (ref 3.5–5.1)
Sodium: 134 mmol/L — ABNORMAL LOW (ref 135–145)
TCO2: 20 mmol/L — ABNORMAL LOW (ref 22–32)

## 2020-08-26 LAB — APTT
aPTT: 35 seconds (ref 24–36)
aPTT: 36 seconds (ref 24–36)

## 2020-08-26 LAB — PROTIME-INR
INR: 1.2 (ref 0.8–1.2)
INR: 1.2 (ref 0.8–1.2)
Prothrombin Time: 14.4 seconds (ref 11.4–15.2)
Prothrombin Time: 14.4 seconds (ref 11.4–15.2)

## 2020-08-26 LAB — GLUCOSE, CAPILLARY
Glucose-Capillary: 115 mg/dL — ABNORMAL HIGH (ref 70–99)
Glucose-Capillary: 128 mg/dL — ABNORMAL HIGH (ref 70–99)
Glucose-Capillary: 138 mg/dL — ABNORMAL HIGH (ref 70–99)
Glucose-Capillary: 116 mg/dL — ABNORMAL HIGH (ref 70–99)
Glucose-Capillary: 82 mg/dL (ref 70–99)

## 2020-08-26 LAB — TROPONIN I (HIGH SENSITIVITY): Troponin I (High Sensitivity): 286 ng/L (ref <18)

## 2020-08-26 LAB — VANCOMYCIN, TROUGH: Vancomycin Tr: 18 ug/mL (ref 15–20)

## 2020-08-26 LAB — AMYLASE: Amylase: 46 U/L (ref 28–100)

## 2020-08-26 LAB — GAMMA GT: GGT: 37 U/L (ref 7–50)

## 2020-08-26 MED ORDER — DEXTROSE 50 % IV SOLN
50.0000 mL | Freq: Once | INTRAVENOUS | Status: AC
  Administered 2020-08-26: 20:00:00 50 mL via INTRAVENOUS
  Filled 2020-08-26: qty 50

## 2020-08-26 MED ORDER — LACTATED RINGERS IV SOLN: INTRAVENOUS | Status: DC

## 2020-08-26 MED ORDER — DESMOPRESSIN ACETATE 4 MCG/ML IJ SOLN
2.0000 ug | Freq: Once | INTRAMUSCULAR | Status: AC
  Administered 2020-08-26: 11:00:00 2 ug via INTRAVENOUS
  Filled 2020-08-26: qty 1

## 2020-08-26 MED ORDER — POTASSIUM CHLORIDE 20 MEQ PO PACK: 60.0000 meq | PACK | Freq: Three times a day (TID) | ORAL | Status: DC

## 2020-08-26 MED ORDER — NOREPINEPHRINE 4 MG/250ML-% IV SOLN
INTRAVENOUS | Status: AC
  Administered 2020-08-26: 15:00:00 5 ug/min via INTRAVENOUS
  Filled 2020-08-26: qty 250

## 2020-08-26 MED ORDER — SODIUM CHLORIDE 0.9 % IV SOLN
10.0000 ug/h | INTRAVENOUS | Status: DC
  Administered 2020-08-26: 22:00:00 10 ug/h via INTRAVENOUS
  Filled 2020-08-26: qty 10

## 2020-08-26 MED ORDER — SODIUM CHLORIDE 0.9 % IV SOLN: INTRAVENOUS | Status: DC | PRN

## 2020-08-26 MED ORDER — SODIUM CHLORIDE 0.9 % IV SOLN
INTRAVENOUS | Status: DC | PRN
  Administered 2020-08-26: 21:00:00 500 mL via INTRAVENOUS
  Administered 2020-08-27: 14:00:00 20 mL via INTRAVENOUS

## 2020-08-26 MED ORDER — NICARDIPINE HCL IN NACL 40-0.83 MG/200ML-% IV SOLN
3.0000 mg/h | INTRAVENOUS | Status: DC
  Administered 2020-08-27: 11:00:00 5 mg/h via INTRAVENOUS
  Administered 2020-08-28: 09:00:00 2.5 mg/h via INTRAVENOUS
  Filled 2020-08-26 (×2): qty 200

## 2020-08-26 MED ORDER — IPRATROPIUM BROMIDE 0.02 % IN SOLN
0.5000 mg | RESPIRATORY_TRACT | Status: DC
  Administered 2020-08-26 – 2020-08-28 (×10): 0.5 mg via RESPIRATORY_TRACT
  Filled 2020-08-26 (×10): qty 2.5

## 2020-08-26 MED ORDER — LEVOTHYROXINE SODIUM 100 MCG/5ML IV SOLN
20.0000 ug | Freq: Once | INTRAVENOUS | Status: AC
  Administered 2020-08-26: 21:00:00 20 ug via INTRAVENOUS
  Filled 2020-08-26: qty 5

## 2020-08-26 MED ORDER — NOREPINEPHRINE 4 MG/250ML-% IV SOLN
0.0000 ug/min | INTRAVENOUS | Status: DC
  Administered 2020-08-26: 19:00:00 3 ug/min via INTRAVENOUS

## 2020-08-26 MED ORDER — SODIUM CHLORIDE 0.9 % IV SOLN
2000.0000 mg | Freq: Once | INTRAVENOUS | Status: AC
  Administered 2020-08-26: 22:00:00 2000 mg via INTRAVENOUS
  Filled 2020-08-26: qty 16

## 2020-08-26 MED ORDER — ALBUTEROL SULFATE (2.5 MG/3ML) 0.083% IN NEBU
2.5000 mg | INHALATION_SOLUTION | RESPIRATORY_TRACT | Status: DC
  Administered 2020-08-26 – 2020-08-28 (×10): 2.5 mg via RESPIRATORY_TRACT
  Filled 2020-08-26 (×10): qty 3

## 2020-08-26 MED ORDER — VASOPRESSIN 20 UNITS/100 ML INFUSION FOR ORGAN DONOR: 0.4000 [IU]/h | INTRAVENOUS | Status: DC

## 2020-08-26 MED ORDER — LACTATED RINGERS IV BOLUS
1000.0000 mL | Freq: Once | INTRAVENOUS | Status: AC
  Administered 2020-08-26: 20:00:00 1000 mL via INTRAVENOUS

## 2020-08-26 MED ORDER — POTASSIUM CHLORIDE 10 MEQ/50ML IV SOLN
10.0000 meq | INTRAVENOUS | Status: AC
  Administered 2020-08-26 – 2020-08-27 (×4): 10 meq via INTRAVENOUS
  Filled 2020-08-26 (×4): qty 50

## 2020-08-26 MED ORDER — LACTATED RINGERS IV BOLUS
1000.0000 mL | Freq: Once | INTRAVENOUS | Status: AC
  Administered 2020-08-26: 10:00:00 1000 mL via INTRAVENOUS

## 2020-08-26 MED ORDER — POTASSIUM CHLORIDE 20 MEQ PO PACK
40.0000 meq | PACK | Freq: Three times a day (TID) | ORAL | Status: DC
  Administered 2020-08-26 (×2): 40 meq
  Filled 2020-08-26 (×2): qty 2

## 2020-08-26 MED ORDER — LACTATED RINGERS IV BOLUS: 500.0000 mL | Freq: Once | INTRAVENOUS | Status: DC

## 2020-08-26 MED ORDER — POTASSIUM PHOSPHATES 15 MMOLE/5ML IV SOLN
30.0000 mmol | Freq: Once | INTRAVENOUS | Status: AC
  Administered 2020-08-26: 07:00:00 30 mmol via INTRAVENOUS
  Filled 2020-08-26: qty 10

## 2020-08-26 MED ORDER — INSULIN ASPART 100 UNIT/ML ~~LOC~~ SOLN
20.0000 [IU] | Freq: Once | SUBCUTANEOUS | Status: AC
  Administered 2020-08-26: 20:00:00 20 [IU] via SUBCUTANEOUS

## 2020-08-26 NOTE — TOC Progression Note (Addendum)
Transition of Care [facility]) - Progression Note    Patient Details  Name: Sumner Boast MRN: [id number] Date of Birth: 09/12/1875  Transition of Care [facility]) CM/SW Contact  Janae Bridgeman, RN Phone Number: 09/08/2020, 9:43 AM  Clinical Narrative:    Olevia Bowens [phone]) called and notified me that the patient has been identified as Maxine Glenn, DOB [birth year].  The police department investigator is trying to locate next of kin at this point and should return my call and update me at this point.   Case management notified the care team and will follow up with any updated information.  Lorinda Creed, NP, Palliative care was notified and I will follow up with her as soon as I am updated on next of kin.  I spoke with Freda Munro, admitting at [phone] and the patient's name and date of birth were updated in the chart accordingly.  For information purposes - [facility] department contacts include Evlyn Kanner - [phone], Corporal Bing Plume [phone].  Case management and medical social work will continue to follow for patient's transition of care needs.  The patient's next of kin was updated in the chart and Palliative care and medical team to be updated.        Expected Discharge Plan and Services                                                 Social Determinants of Health (SDOH) Interventions    Readmission Risk Interventions No flowsheet data found.

## 2020-08-26 NOTE — Procedures (Signed)
    TRANSESOPHAGEAL ECHOCARDIOGRAM   NAME:  Terry Mathews    MRN: [id number] DOB:  Oct 27, 1990    ADMIT DATE: 08/29/2020  INDICATIONS: Organ donation/Poor TTE windows   PROCEDURE:   Informed consent was obtained prior to the procedure. The risks, benefits and alternatives for the procedure were discussed and the patient comprehended these risks.  Risks include, but are not limited to, cough, sore throat, vomiting, nausea, somnolence, esophageal and stomach trauma or perforation, bleeding, low blood pressure, aspiration, pneumonia, infection, trauma to the teeth and death.    Procedural time out performed.   Anesthesia was administered by Dr. Merrily Pew. The patient's heart rate, blood pressure, and oxygen saturation are monitored continuously during the procedure. The period of sedation is 20 minutes, of which I was present face-to-face 100% of this time.   The transesophageal probe was inserted in the esophagus and stomach without difficulty and multiple views were obtained.   COMPLICATIONS:    There were no immediate complications.  KEY FINDINGS:  1. Moderately reduced LVEF, 30-35% with global hypokinesis. 2. Mildly reduced RV function.  3. Normal chamber dimensions.  4. No valvular vegetations.  5. Full report to follow. 6. Further management per primary team.   Lenna Gilford. Flora Lipps, MD [facility]  [address] [place], [place] [address] [phone]  8:18 PM

## 2020-08-26 NOTE — Progress Notes (Signed)
  Echocardiogram Echocardiogram Transesophageal has been performed.  Delcie Roch 09/04/2020, 8:06 PM

## 2020-08-26 NOTE — Progress Notes (Signed)
GF at bedside

## 2020-08-26 NOTE — Progress Notes (Signed)
Adult Brain Death Determination  Time of Examination: 08/22/2020 1:32 PM  A. No Evidence of /Cause of Reversible CNS Depression  1. Core temperature must be greater >36 degrees. Last temp: Temp: 98.6 F (37 C) (Note: If unable to achieve normothermia after 12 hours of temperature management, may consider proceeding with Brain Death Evaluation.):    yes  2. Evidence of severe metabolic perturbations that could potentate CNS depression. Consider glucose, Na, creatinine, PaCO2, SaO2.:    Absent  3. Evidence of drugs, by history or measurement, that could potentiate central nervous system depression: narcotics, ethanol, benzodiazepines, barbiturates, neuromuscular blockade.:     Absent  B. Absence of Cortical Function  1. GCS = 3:    yes  C. Absence of Brain Stem Reflexes and Responses  1. Pupils light-fixed    yes  2. Corneal reflexes:    Absent  3. Response to upper and lower airway stimulation, such as pharyngeal and endotracheal suctioning.:    Absent  4. Ocular response to head turning (eye movement).    Absent  D. Absence of Spontaneous Respirations  (Apnea test performed per Brain Death Policy. If not met due to hemodynamic/ventilatory instability, then perform EEG, TCD, or cerebral blow flow studies.)  1.   Spontaneous Respirations   Absent  2.   PaCO2 at start of apnea test:  38  3.   PaCO2 at end of apnea test:  78  4.   CO2 rise of 20 or greater from baseline:   yes  E. Document Confirmatory Test Utilized: (Optional) Nuclear cerebral flow, cerebral angiography (CT/MR angio), transcranial Doppler ultrasound, EEG, SSEP (record results).  1. Test results (if available):  Consistent with brain dead  Patient pronounced dead by neurological criteria at 1:29 PM on 08/27/2020.  Sudham Chand, MD 08/15/2020 1:32 PM   

## 2020-08-26 NOTE — Progress Notes (Signed)
RT note. RT advanced ETT from 24cm to 27 cm per order. Rt will continue to monitor.

## 2020-08-26 NOTE — Progress Notes (Signed)
Pharmacy Antibiotic Note  Terry Mathews is a 29 y.o. male admitted on 08/30/2020 with sepsis.  Pharmacy has been consulted for vancomycin dosing.  Patient declared brain dead. Planning organ donation.  Vancomycin trough therapeutic at 18.  Plan: Continue vancomycin 750 mg IV every 8 hours Monitor renal function, vancomycin levels as indicated   Height: 5\' 9"  (175.3 cm) Weight: 69.8 kg (153 lb 14.1 oz) IBW/kg (Calculated) : 70.7  Temp (24hrs), Avg:98.6 F (37 C), Min:98.1 F (36.7 C), Max:98.8 F (37.1 C)  Recent Labs  Lab 09/06/2020 1749 08/25/2020 1750 08/25/2020 1753 09/04/2020 1933 08/22/2020 2230 08/24/20 0149 08/25/20 0320 08/23/2020 0404 08/13/2020 1437 09/09/2020 1618 08/29/2020 1917  WBC 10.6*  --   --   --   --  16.5*  --  23.1*  --  18.2*  --   CREATININE  --    < >  --   --   --  0.96 1.23 1.25* 1.19 1.10  --   LATICACIDVEN  --   --  >11.0* 7.8* 4.2* 3.2*  --   --   --   --   --   VANCOTROUGH  --   --   --   --   --   --   --   --   --   --  18   < > = values in this interval not displayed.    Estimated Creatinine Clearance: 97.8 mL/min (by C-G formula based on SCr of 1.1 mg/dL).    No Known Allergies    08/13/2020, PharmD, BCPS Please check AMION for all [facility] contact numbers Clinical Pharmacist 08/22/2020 8:20 PM

## 2020-08-26 NOTE — TOC Progression Note (Addendum)
Transition of Care [facility]) - Progression Note    Patient Details  Name: Terry Mathews MRN: [id number] Date of Birth: 05/06/1991  Transition of Care [facility]) CM/SW Contact  [name], LCSWA Phone Number: 08/14/2020, 11:11 AM  Clinical Narrative:     Update 12/15 3:56pm- CSW received call from nurse that patients mother wanted to speak with CSW in regards to help with finding local funeral homes/questions.CSW spoke with patients mother at bedside. CSW provided the names of a few local funeral homes and directed patients mom to consult with them on any questions in reference to funeral planning. No further questions reported at this time.   CSW will continue to follow.  CSW received call from [employer] with patients identification, Patients name is Terry Mathews DOB: 1991/04/27. His mothers name is Terry Mathews she is located in either [place] or [place]. The telephone numbers given for her are [phone] or [phone]. CSW spoke with case manager michelle to update her also. Detective Venetia Maxon said if we have any questions or concerns to please call at [phone].  TOC team will continue to follow.         Expected Discharge Plan and Services                                                 Social Determinants of Health (SDOH) Interventions    Readmission Risk Interventions No flowsheet data found.

## 2020-08-26 NOTE — Progress Notes (Signed)
*  PRELIMINARY RESULTS*as been performed.  Terry Mathews 09/04/2020, 5:00 PM

## 2020-08-26 NOTE — Consult Note (Signed)
Consultation Note Date: 09/10/2020   Patient Name: Terry Mathews  DOB: 01/27/1991  MRN: [id number]  Age / Sex: 29 y.o., male  PCP: No primary care provider on file. Referring Physician: Jacky Kindle, MD  Reason for Consultation: Establishing goals of care and Psychosocial/spiritual support  HPI/Patient Profile:   29 y.o. year old male who was found down at [place] in [place] followed by [facility]. CPR performed and achieved ROSC after given amiodarone 291m, epi x 6, defib x1. [facility] patient had brainstem reflexes including weak cough, corneal and respiratory triggering.  His pupils were mid dilated and fixed.  Initial head CT scan showed anoxic brain injury.  Patient had EEG which showed diffuse encephalopathy no seizures or myoclonus.  Patient rapidly progress and he lost his all brainstem reflexes.  Patient's family was found by police department and they were notified, patient mother came and informed about his condition and prognosis.  She agreed for brain death evaluation by apnea testing which was performed on 29/04/2020  His initial PCO2 was 38 and with 8 minutes of apnea, his PCO2 went up to 78.  Patient was declared dead by brain-dead evaluation and apnea testing on 08/31/2020 at 1:29 PM.  Mother faces decisions regarding EOL wishes, grief and bereavement.     Clinical Assessment and Goals of Care:  This NP MWadie Lessenreviewed medical records, received report from team, assessed the patient and then meet at the patient's bedside along with his mother/Terry Mathews  to offer support.    Concept of Palliative Care was introduced as specialized medical care for people and their families living with serious illness.  If focuses on providing relief from the symptoms and stress of a serious illness.   Values and goals of care important to  patient and family were attempted to be elicited.  Created space and opportunity for mother  to explore thoughts and feelings regarding current medical situation.    Mother understands the current medical situation and agrees to apnea test today to determine brain death.  This nurse practitioner returned to the bedside after apnea test and determination of brain death.  Mother has agreed to organ donation.  She tells me that her son had put this on his driver's license in the past and that "now he is a hero"  Emotional support offered. Declines chaplain support at this time.   Questions and concerns addressed.  Family  encouraged to call with questions or concerns.     PMT will continue to support holistically.   Patient's next of kin/mother/Terry Mathews Terry Mathews     SUMMARY OF RECOMMENDATIONS    Code Status/Advance Care Planning:  DNR   Psycho-social/Spiritual:   Desire for further Chaplaincy support: denied  Additional Recommendations: Grief/Bereavement Support     Primary Diagnoses: Present on Admission: . Cardiac arrest ([place]   I have reviewed the medical record, interviewed the patient and family, and examined the patient. The following aspects are pertinent.  No  past medical history on file. Social History   Socioeconomic History  . Marital status: Not on file    Spouse name: Not on file  . Number of children: Not on file  . Years of education: Not on file  . Highest education level: Not on file  Occupational History  . Not on file  Tobacco Use  . Smoking status: Not on file  . Smokeless tobacco: Not on file  Substance and Sexual Activity  . Alcohol use: Not on file  . Drug use: Not on file  . Sexual activity: Not on file  Other Topics Concern  . Not on file  Social History Narrative  . Not on file   Social Determinants of Health   Financial Resource Strain: Not on file  Food Insecurity: Not on file  Transportation Needs: Not on file   Physical Activity: Not on file  Stress: Not on file  Social Connections: Not on file   No family history on file. Scheduled Meds: . chlorhexidine gluconate (MEDLINE KIT)  15 mL Mouth Rinse BID  . Chlorhexidine Gluconate Cloth  6 each Topical Daily  . heparin  5,000 Units Subcutaneous Q8H  . insulin aspart  0-9 Units Subcutaneous Q4H  . mouth rinse  15 mL Mouth Rinse 10 times per day  . mupirocin ointment  1 application Nasal BID  . pantoprazole sodium  40 mg Per Tube Daily  . potassium chloride  40 mEq Per Tube TID   Continuous Infusions: . sodium chloride Stopped (08/13/2020 1141)  . ceFEPime (MAXIPIME) IV Stopped (09/01/2020 0500)  . feeding supplement (VITAL AF 1.2 CAL) 65 mL/hr at 08/22/2020 1200  . norepinephrine    . vancomycin 150 mL/hr at 08/25/2020 1200   PRN Meds:.sodium chloride, docusate, polyethylene glycol, polyvinyl alcohol Medications Prior to Admission:  Prior to Admission medications   Not on File   No Known Allergies Review of Systems  Unable to perform ROS: Intubated    Physical Exam Constitutional:      Appearance: He is underweight.     Interventions: He is intubated.  Cardiovascular:     Rate and Rhythm: Normal rate.  Pulmonary:     Effort: He is intubated.  Skin:    General: Skin is warm and dry.  Neurological:     Cranial Nerves: Cranial nerve deficit present.     Sensory: Sensory deficit present.     Vital Signs: BP 97/64   Pulse 96   Temp 98.6 F (37 C) (Core)   Resp (!) 30   Ht 5' 9"  (1.753 m)   Wt 69.8 kg   SpO2 96%   BMI 22.72 kg/m  Pain Scale: CPOT       SpO2: SpO2: 96 % O2 Device:SpO2: 96 % O2 Flow Rate: .O2 Flow Rate (L/min): 100 L/min  IO: Intake/output summary:   Intake/Output Summary (Last 24 hours) at 09/04/2020 1324 Last data filed at 09/11/2020 1200 Gross per 24 hour  Intake 3748.89 ml  Output 1270 ml  Net 2478.89 ml    LBM: Last BM Date: 08/22/2020 Baseline Weight: Weight: 70 kg Most recent weight: Weight:  69.8 kg     Palliative Assessment/Data:   Discussed with Thayer Headings RN  Time In: 1300 Time Out: 1415 Time Total: 75 minutes Greater than 50%  of this time was spent counseling and coordinating care related to the above assessment and plan.  Signed by: Wadie Lessen, NP   Please contact Palliative Medicine Team phone at [phone] for questions and  concerns.  For individual provider: See Shea Evans

## 2020-08-26 NOTE — Procedures (Signed)
Diagnostic Bronchoscopy Procedure Note   Tolbert Matheson  [id number]  Apr 07, 1991  Date:08/17/2020  Time:5:07 PM   Provider Performing:Annebelle Bostic A Dhanya Bogle  Procedure: Diagnostic Bronchoscopy (77412)  Indication(s) surveillance of airway and BAL for organ donation  Consent Risks of the procedure as well as the alternatives and risks of each were explained to the patient and/or caregiver.  Consent for the procedure was obtained.   Time Out Verified patient identification, verified procedure,   Sterile Technique Usual hand hygiene, masks   Procedure Description Bronchoscope advanced through endotracheal tube and into airway.  After suctioning out tracheal secretions, bronchoscope was advanced into left main, airway noted to be free of lesions, thin white secretions noted, suctioned effectively bronchoscope advance slowly left lower lobe-bronchoalveolar lavage was performed with about 60 cc of saline with good return Bronchoscope withdrawn back to the carina introduced into the right main right upper lobe, right lower lobe right middle lobe bronchi well visualized noted to be free of lesions, thin white secretions noted suction effectively Bronchoalveolar lavage was performed with 60 cc of saline with good return  A further sample was obtained from the left lower lobe  Samples will be sent for analysis  Tolerated procedure well  No airway abnormality noted, thin secretions suctioned effectively, no airway hyperemia  Complications/Tolerance None; patient tolerated the procedure well..   EBL None   Specimen(s)  BAL left lower lobe BAL right lower lobe BAL left lower lobe

## 2020-08-26 NOTE — Progress Notes (Signed)
CRITICAL VALUE ALERT  Critical Value:  Troponin 286  Date & Time Notied:  09/07/2020 1805  Provider Notified: Efraim Kaufmann with [facility] notified  Orders Received/Actions taken: NA

## 2020-08-26 NOTE — Progress Notes (Signed)
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 Clinical situation c/w ongoing cerebral edema and completing the process of herniation. I see no value that a CT Scan would add in this setting as the patient would not be a candidate for intervention as it would not alter the ultimate outcome.   eICU Interventions  Continue present management.      Intervention Category Major Interventions: Other:  [name] 09-12-2020, 12:53 AM

## 2020-08-26 NOTE — Progress Notes (Signed)
Assisted MD with apnea test for brain death. Pt placed back on vent after test.

## 2020-08-26 NOTE — Progress Notes (Signed)
Pt transported to and from CT scan on the ventilator without incident. 

## 2020-08-26 NOTE — Progress Notes (Signed)
°   2020-08-31 1157  Clinical Encounter Type  Visited With Patient and family together  Visit Type Initial  Referral From Nurse  Consult/Referral To Chaplain   Chaplain responded to page from [place]. Pt's mom, Lucilla Edin, was at bedside. Pt's mom stated a chaplain wasn't needed right now, but that she might want one after Pt's brain tests. Chaplain assured her that chaplains are here 24/7 and can come back when she wants one.  This note was prepared by Chaplain Resident, Tacy Learn, MDiv. Chaplain remains available as needed through the on-call pager: [phone].

## 2020-08-26 NOTE — Progress Notes (Signed)
eLink Physician-Brief Progress Note Patient Name: Ellery Plunk Doe DOB: 09/12/1875 MRN: [id number]   Date of Service  Sep 16, 2020  HPI/Events of Note  Hypokalemia  Hypophosphatemia - K+ = 2.6 and PO4--- = 2.0.   eICU Interventions  Plan: 1. Replace K+ and PO4---. 2. Repeat BMP and Phosphorus at 2 PM.     Intervention Category Major Interventions: Electrolyte abnormality - evaluation and management  Jaylan Hinojosa Eugene 09/16/2020, 5:15 AM

## 2020-08-26 NOTE — Procedures (Signed)
Arterial Catheter Insertion Procedure Note  Terry Mathews  [id number]  October 05, 1990  Date:19-Sep-2020  Time:3:32 PM    Provider Performing: Melanee Spry    Procedure: Insertion of Arterial Line (01027) without US guidance  Indication(s) Blood pressure monitoring and/or need for frequent ABGs  Consent Unable to obtain consent due to emergent nature of procedure.  Anesthesia None   Time Out Verified patient identification, verified procedure, site/side was marked, verified correct patient position, special equipment/implants available, medications/allergies/relevant history reviewed, required imaging and test results available.   Sterile Technique Maximal sterile technique including full sterile barrier drape, hand hygiene, sterile gown, sterile gloves, mask, hair covering, sterile ultrasound probe cover (if used).   Procedure Description Area of catheter insertion was cleaned with chlorhexidine and draped in sterile fashion. With real-time ultrasound guidance an arterial catheter was placed into the right radial artery.  Appropriate arterial tracings confirmed on monitor.     Complications/Tolerance None; patient tolerated the procedure well.   EBL Minimal   Specimen(s) None

## 2020-08-26 NOTE — Progress Notes (Signed)
eLink Physician-Brief Progress Note Patient Name: Terry Mathews DOB: 20-Mar-1991 MRN: [id number]   Date of Service  08/12/2020  HPI/Events of Note  CT Chest/Abd/Pelvis performed at 1830 this evening as part of organ donor evaluation shows extensive pneumomediastinum and small bilateral pneumothoraces. Distal tip of ETT is 7.7 cm above the carina.  There was also a moderate infiltrate in lung bases bilaterally compatible with aspiration. There is subtotal collapse and consolidation of the left lower lobe. There is extensive endoluminal debris noted within the LLL pulmonary bronchi and to a smaller extent in the RLL and RML bronchi.  Patient is intubated and ventilated on PRVC 560 (8cc/kg) x 22, PEEP 10, FiO2 80%. Last ABG showed PaO2 of 63, though current SpO2 is 100%.   eICU Interventions  Advance ETT by 3 cm then repeat CXR to both confirm appropriate ETT position and re-evaluate size of bilateral PTX. Will also order an AM CXR for same reason.     Intervention Category Major Interventions: Other:  Terry Mathews 09/10/2020, 8:32 PM

## 2020-08-26 NOTE — Progress Notes (Signed)
Dr. Flora Lipps at bedside to do bedside TEE.

## 2020-08-27 ENCOUNTER — Inpatient Hospital Stay (HOSPITAL_COMMUNITY): Payer: Medicaid Other

## 2020-08-27 DIAGNOSIS — Z529 Donor of unspecified organ or tissue: Secondary | ICD-10-CM

## 2020-08-27 DIAGNOSIS — I469 Cardiac arrest, cause unspecified: Secondary | ICD-10-CM

## 2020-08-27 LAB — BASIC METABOLIC PANEL
Anion gap: 11 (ref 5–15)
Anion gap: 15 (ref 5–15)
Anion gap: 14 (ref 5–15)
BUN: 29 mg/dL — ABNORMAL HIGH (ref 6–20)
BUN: 22 mg/dL — ABNORMAL HIGH (ref 6–20)
BUN: 20 mg/dL (ref 6–20)
CO2: 25 mmol/L (ref 22–32)
CO2: 24 mmol/L (ref 22–32)
CO2: 26 mmol/L (ref 22–32)
Calcium: 8 mg/dL — ABNORMAL LOW (ref 8.9–10.3)
Calcium: 8.3 mg/dL — ABNORMAL LOW (ref 8.9–10.3)
Calcium: 8.5 mg/dL — ABNORMAL LOW (ref 8.9–10.3)
Chloride: 119 mmol/L — ABNORMAL HIGH (ref 98–111)
Chloride: 110 mmol/L (ref 98–111)
Chloride: 114 mmol/L — ABNORMAL HIGH (ref 98–111)
Creatinine, Ser: 0.93 mg/dL (ref 0.61–1.24)
Creatinine, Ser: 0.93 mg/dL (ref 0.61–1.24)
Creatinine, Ser: 0.88 mg/dL (ref 0.61–1.24)
GFR, Estimated: >60 mL/min (ref >60)
GFR, Estimated: >60 mL/min (ref >60)
GFR, Estimated: >60 mL/min (ref >60)
Glucose, Bld: 137 mg/dL — ABNORMAL HIGH (ref 70–99)
Glucose, Bld: 264 mg/dL — ABNORMAL HIGH (ref 70–99)
Glucose, Bld: 235 mg/dL — ABNORMAL HIGH (ref 70–99)
Potassium: 4.8 mmol/L (ref 3.5–5.1)
Potassium: 4.7 mmol/L (ref 3.5–5.1)
Potassium: 4.2 mmol/L (ref 3.5–5.1)
Sodium: 155 mmol/L — ABNORMAL HIGH (ref 135–145)
Sodium: 149 mmol/L — ABNORMAL HIGH (ref 135–145)
Sodium: 154 mmol/L — ABNORMAL HIGH (ref 135–145)

## 2020-08-27 LAB — CBC WITH DIFFERENTIAL/PLATELET
Abs Immature Granulocytes: 0.13 10*3/uL — ABNORMAL HIGH (ref 0.00–0.07)
Abs Immature Granulocytes: 0.09 10*3/uL — ABNORMAL HIGH (ref 0.00–0.07)
Abs Immature Granulocytes: 0.1 10*3/uL — ABNORMAL HIGH (ref 0.00–0.07)
Abs Immature Granulocytes: 0.14 10*3/uL — ABNORMAL HIGH (ref 0.00–0.07)
Basophils Absolute: 0.1 10*3/uL (ref 0.0–0.1)
Basophils Absolute: 0 10*3/uL (ref 0.0–0.1)
Basophils Absolute: 0 10*3/uL (ref 0.0–0.1)
Basophils Absolute: 0 10*3/uL (ref 0.0–0.1)
Basophils Relative: 0 %
Basophils Relative: 0 %
Basophils Relative: 0 %
Basophils Relative: 0 %
Eosinophils Absolute: 0 10*3/uL (ref 0.0–0.5)
Eosinophils Absolute: 0 10*3/uL (ref 0.0–0.5)
Eosinophils Absolute: 0 10*3/uL (ref 0.0–0.5)
Eosinophils Absolute: 0 10*3/uL (ref 0.0–0.5)
Eosinophils Relative: 0 %
Eosinophils Relative: 0 %
Eosinophils Relative: 0 %
Eosinophils Relative: 0 %
HCT: 37.8 % — ABNORMAL LOW (ref 39.0–52.0)
HCT: 38.4 % — ABNORMAL LOW (ref 39.0–52.0)
HCT: 37.3 % — ABNORMAL LOW (ref 39.0–52.0)
HCT: 36.7 % — ABNORMAL LOW (ref 39.0–52.0)
Hemoglobin: 12.1 g/dL — ABNORMAL LOW (ref 13.0–17.0)
Hemoglobin: 12.3 g/dL — ABNORMAL LOW (ref 13.0–17.0)
Hemoglobin: 11.4 g/dL — ABNORMAL LOW (ref 13.0–17.0)
Hemoglobin: 11.3 g/dL — ABNORMAL LOW (ref 13.0–17.0)
Immature Granulocytes: 1 %
Immature Granulocytes: 1 %
Immature Granulocytes: 1 %
Immature Granulocytes: 1 %
Lymphocytes Relative: 4 %
Lymphocytes Relative: 5 %
Lymphocytes Relative: 6 %
Lymphocytes Relative: 6 %
Lymphs Abs: 0.6 10*3/uL — ABNORMAL LOW (ref 0.7–4.0)
Lymphs Abs: 0.5 10*3/uL — ABNORMAL LOW (ref 0.7–4.0)
Lymphs Abs: 0.7 10*3/uL (ref 0.7–4.0)
Lymphs Abs: 0.7 10*3/uL (ref 0.7–4.0)
MCH: 30.8 pg (ref 26.0–34.0)
MCH: 30.6 pg (ref 26.0–34.0)
MCH: 29.8 pg (ref 26.0–34.0)
MCH: 29.9 pg (ref 26.0–34.0)
MCHC: 32 g/dL (ref 30.0–36.0)
MCHC: 32 g/dL (ref 30.0–36.0)
MCHC: 30.6 g/dL (ref 30.0–36.0)
MCHC: 30.8 g/dL (ref 30.0–36.0)
MCV: 96.2 fL (ref 80.0–100.0)
MCV: 95.5 fL (ref 80.0–100.0)
MCV: 97.4 fL (ref 80.0–100.0)
MCV: 97.1 fL (ref 80.0–100.0)
Monocytes Absolute: 0.2 10*3/uL (ref 0.1–1.0)
Monocytes Absolute: 0.2 10*3/uL (ref 0.1–1.0)
Monocytes Absolute: 0.2 10*3/uL (ref 0.1–1.0)
Monocytes Absolute: 0.6 10*3/uL (ref 0.1–1.0)
Monocytes Relative: 2 %
Monocytes Relative: 2 %
Monocytes Relative: 2 %
Monocytes Relative: 5 %
Neutro Abs: 13.2 10*3/uL — ABNORMAL HIGH (ref 1.7–7.7)
Neutro Abs: 9.3 10*3/uL — ABNORMAL HIGH (ref 1.7–7.7)
Neutro Abs: 9.4 10*3/uL — ABNORMAL HIGH (ref 1.7–7.7)
Neutro Abs: 9.4 10*3/uL — ABNORMAL HIGH (ref 1.7–7.7)
Neutrophils Relative %: 93 %
Neutrophils Relative %: 92 %
Neutrophils Relative %: 91 %
Neutrophils Relative %: 88 %
Platelets: 240 10*3/uL (ref 150–400)
Platelets: 236 10*3/uL (ref 150–400)
Platelets: 200 10*3/uL (ref 150–400)
Platelets: 213 10*3/uL (ref 150–400)
RBC: 3.93 MIL/uL — ABNORMAL LOW (ref 4.22–5.81)
RBC: 4.02 MIL/uL — ABNORMAL LOW (ref 4.22–5.81)
RBC: 3.83 MIL/uL — ABNORMAL LOW (ref 4.22–5.81)
RBC: 3.78 MIL/uL — ABNORMAL LOW (ref 4.22–5.81)
RDW: 14.5 % (ref 11.5–15.5)
RDW: 14.5 % (ref 11.5–15.5)
RDW: 14.3 % (ref 11.5–15.5)
RDW: 14.4 % (ref 11.5–15.5)
WBC Morphology: INCREASED
WBC: 14.3 10*3/uL — ABNORMAL HIGH (ref 4.0–10.5)
WBC: 10.1 10*3/uL (ref 4.0–10.5)
WBC: 10.4 10*3/uL (ref 4.0–10.5)
WBC: 10.8 10*3/uL — ABNORMAL HIGH (ref 4.0–10.5)
nRBC: 0 % (ref 0.0–0.2)
nRBC: 0 % (ref 0.0–0.2)
nRBC: 0 % (ref 0.0–0.2)
nRBC: 0 % (ref 0.0–0.2)

## 2020-08-27 LAB — POCT I-STAT 7, (LYTES, BLD GAS, ICA,H+H)
Acid-Base Excess: 0 mmol/L (ref 0.0–2.0)
Acid-Base Excess: 0 mmol/L (ref 0.0–2.0)
Acid-Base Excess: 1 mmol/L (ref 0.0–2.0)
Acid-Base Excess: 1 mmol/L (ref 0.0–2.0)
Acid-Base Excess: 1 mmol/L (ref 0.0–2.0)
Acid-Base Excess: 4 mmol/L — ABNORMAL HIGH (ref 0.0–2.0)
Acid-Base Excess: 4 mmol/L — ABNORMAL HIGH (ref 0.0–2.0)
Acid-base deficit: 1 mmol/L (ref 0.0–2.0)
Bicarbonate: 25.5 mmol/L (ref 20.0–28.0)
Bicarbonate: 26.1 mmol/L (ref 20.0–28.0)
Bicarbonate: 26.3 mmol/L (ref 20.0–28.0)
Bicarbonate: 27.3 mmol/L (ref 20.0–28.0)
Bicarbonate: 27.3 mmol/L (ref 20.0–28.0)
Bicarbonate: 28.6 mmol/L — ABNORMAL HIGH (ref 20.0–28.0)
Bicarbonate: 29.9 mmol/L — ABNORMAL HIGH (ref 20.0–28.0)
Bicarbonate: 30.4 mmol/L — ABNORMAL HIGH (ref 20.0–28.0)
Calcium, Ion: 1.12 mmol/L — ABNORMAL LOW (ref 1.15–1.40)
Calcium, Ion: 1.13 mmol/L — ABNORMAL LOW (ref 1.15–1.40)
Calcium, Ion: 1.13 mmol/L — ABNORMAL LOW (ref 1.15–1.40)
Calcium, Ion: 1.13 mmol/L — ABNORMAL LOW (ref 1.15–1.40)
Calcium, Ion: 1.16 mmol/L (ref 1.15–1.40)
Calcium, Ion: 1.17 mmol/L (ref 1.15–1.40)
Calcium, Ion: 1.18 mmol/L (ref 1.15–1.40)
Calcium, Ion: 1.18 mmol/L (ref 1.15–1.40)
HCT: 32 % — ABNORMAL LOW (ref 39.0–52.0)
HCT: 33 % — ABNORMAL LOW (ref 39.0–52.0)
HCT: 33 % — ABNORMAL LOW (ref 39.0–52.0)
HCT: 34 % — ABNORMAL LOW (ref 39.0–52.0)
HCT: 35 % — ABNORMAL LOW (ref 39.0–52.0)
HCT: 35 % — ABNORMAL LOW (ref 39.0–52.0)
HCT: 35 % — ABNORMAL LOW (ref 39.0–52.0)
HCT: 37 % — ABNORMAL LOW (ref 39.0–52.0)
Hemoglobin: 10.9 g/dL — ABNORMAL LOW (ref 13.0–17.0)
Hemoglobin: 11.2 g/dL — ABNORMAL LOW (ref 13.0–17.0)
Hemoglobin: 11.2 g/dL — ABNORMAL LOW (ref 13.0–17.0)
Hemoglobin: 11.6 g/dL — ABNORMAL LOW (ref 13.0–17.0)
Hemoglobin: 11.9 g/dL — ABNORMAL LOW (ref 13.0–17.0)
Hemoglobin: 11.9 g/dL — ABNORMAL LOW (ref 13.0–17.0)
Hemoglobin: 11.9 g/dL — ABNORMAL LOW (ref 13.0–17.0)
Hemoglobin: 12.6 g/dL — ABNORMAL LOW (ref 13.0–17.0)
O2 Saturation: 100 %
O2 Saturation: 100 %
O2 Saturation: 100 %
O2 Saturation: 100 %
O2 Saturation: 94 %
O2 Saturation: 98 %
O2 Saturation: 99 %
O2 Saturation: 99 %
Patient temperature: 35.4
Patient temperature: 36.1
Patient temperature: 36.4
Patient temperature: 36.6
Patient temperature: 36.6
Patient temperature: 37.1
Patient temperature: 37.2
Patient temperature: 37.7
Potassium: 4.2 mmol/L (ref 3.5–5.1)
Potassium: 4.2 mmol/L (ref 3.5–5.1)
Potassium: 4.3 mmol/L (ref 3.5–5.1)
Potassium: 4.3 mmol/L (ref 3.5–5.1)
Potassium: 4.3 mmol/L (ref 3.5–5.1)
Potassium: 4.4 mmol/L (ref 3.5–5.1)
Potassium: 4.7 mmol/L (ref 3.5–5.1)
Potassium: 4.7 mmol/L (ref 3.5–5.1)
Sodium: 152 mmol/L — ABNORMAL HIGH (ref 135–145)
Sodium: 153 mmol/L — ABNORMAL HIGH (ref 135–145)
Sodium: 153 mmol/L — ABNORMAL HIGH (ref 135–145)
Sodium: 154 mmol/L — ABNORMAL HIGH (ref 135–145)
Sodium: 155 mmol/L — ABNORMAL HIGH (ref 135–145)
Sodium: 157 mmol/L — ABNORMAL HIGH (ref 135–145)
Sodium: 157 mmol/L — ABNORMAL HIGH (ref 135–145)
Sodium: 157 mmol/L — ABNORMAL HIGH (ref 135–145)
TCO2: 27 mmol/L (ref 22–32)
TCO2: 28 mmol/L (ref 22–32)
TCO2: 28 mmol/L (ref 22–32)
TCO2: 29 mmol/L (ref 22–32)
TCO2: 29 mmol/L (ref 22–32)
TCO2: 30 mmol/L (ref 22–32)
TCO2: 31 mmol/L (ref 22–32)
TCO2: 32 mmol/L (ref 22–32)
pCO2 arterial: 45.2 mmHg (ref 32.0–48.0)
pCO2 arterial: 46.1 mmHg (ref 32.0–48.0)
pCO2 arterial: 48.1 mmHg — ABNORMAL HIGH (ref 32.0–48.0)
pCO2 arterial: 49.4 mmHg — ABNORMAL HIGH (ref 32.0–48.0)
pCO2 arterial: 50 mmHg — ABNORMAL HIGH (ref 32.0–48.0)
pCO2 arterial: 51.5 mmHg — ABNORMAL HIGH (ref 32.0–48.0)
pCO2 arterial: 53.2 mmHg — ABNORMAL HIGH (ref 32.0–48.0)
pCO2 arterial: 58.2 mmHg — ABNORMAL HIGH (ref 32.0–48.0)
pH, Arterial: 7.295 — ABNORMAL LOW (ref 7.350–7.450)
pH, Arterial: 7.306 — ABNORMAL LOW (ref 7.350–7.450)
pH, Arterial: 7.313 — ABNORMAL LOW (ref 7.350–7.450)
pH, Arterial: 7.357 (ref 7.350–7.450)
pH, Arterial: 7.36 (ref 7.350–7.450)
pH, Arterial: 7.373 (ref 7.350–7.450)
pH, Arterial: 7.389 (ref 7.350–7.450)
pH, Arterial: 7.391 (ref 7.350–7.450)
pO2, Arterial: 119 mmHg — ABNORMAL HIGH (ref 83.0–108.0)
pO2, Arterial: 136 mmHg — ABNORMAL HIGH (ref 83.0–108.0)
pO2, Arterial: 154 mmHg — ABNORMAL HIGH (ref 83.0–108.0)
pO2, Arterial: 212 mmHg — ABNORMAL HIGH (ref 83.0–108.0)
pO2, Arterial: 272 mmHg — ABNORMAL HIGH (ref 83.0–108.0)
pO2, Arterial: 304 mmHg — ABNORMAL HIGH (ref 83.0–108.0)
pO2, Arterial: 337 mmHg — ABNORMAL HIGH (ref 83.0–108.0)
pO2, Arterial: 68 mmHg — ABNORMAL LOW (ref 83.0–108.0)

## 2020-08-27 LAB — HEPATIC FUNCTION PANEL
ALT: 56 U/L — ABNORMAL HIGH (ref 0–44)
ALT: 56 U/L — ABNORMAL HIGH (ref 0–44)
ALT: 50 U/L — ABNORMAL HIGH (ref 0–44)
AST: 28 U/L (ref 15–41)
AST: 25 U/L (ref 15–41)
AST: 25 U/L (ref 15–41)
Albumin: 2 g/dL — ABNORMAL LOW (ref 3.5–5.0)
Albumin: 2.9 g/dL — ABNORMAL LOW (ref 3.5–5.0)
Albumin: 2.6 g/dL — ABNORMAL LOW (ref 3.5–5.0)
Alkaline Phosphatase: 99 U/L (ref 38–126)
Alkaline Phosphatase: 107 U/L (ref 38–126)
Alkaline Phosphatase: 93 U/L (ref 38–126)
Bilirubin, Direct: 0.1 mg/dL (ref 0.0–0.2)
Bilirubin, Direct: <0.1 mg/dL (ref 0.0–0.2)
Bilirubin, Direct: <0.1 mg/dL (ref 0.0–0.2)
Indirect Bilirubin: 0.3 mg/dL (ref 0.3–0.9)
Total Bilirubin: 0.4 mg/dL (ref 0.3–1.2)
Total Bilirubin: 0.6 mg/dL (ref 0.3–1.2)
Total Bilirubin: 0.3 mg/dL (ref 0.3–1.2)
Total Protein: 5.9 g/dL — ABNORMAL LOW (ref 6.5–8.1)
Total Protein: 7.2 g/dL (ref 6.5–8.1)
Total Protein: 6.3 g/dL — ABNORMAL LOW (ref 6.5–8.1)

## 2020-08-27 LAB — COMPREHENSIVE METABOLIC PANEL
ALT: 50 U/L — ABNORMAL HIGH (ref 0–44)
AST: 26 U/L (ref 15–41)
Albumin: 2.6 g/dL — ABNORMAL LOW (ref 3.5–5.0)
Alkaline Phosphatase: 95 U/L (ref 38–126)
Anion gap: 11 (ref 5–15)
BUN: 19 mg/dL (ref 6–20)
CO2: 27 mmol/L (ref 22–32)
Calcium: 8.7 mg/dL — ABNORMAL LOW (ref 8.9–10.3)
Chloride: 119 mmol/L — ABNORMAL HIGH (ref 98–111)
Creatinine, Ser: 0.8 mg/dL (ref 0.61–1.24)
GFR, Estimated: >60 mL/min (ref >60)
Glucose, Bld: 191 mg/dL — ABNORMAL HIGH (ref 70–99)
Potassium: 4 mmol/L (ref 3.5–5.1)
Sodium: 157 mmol/L — ABNORMAL HIGH (ref 135–145)
Total Bilirubin: 0.6 mg/dL (ref 0.3–1.2)
Total Protein: 6.3 g/dL — ABNORMAL LOW (ref 6.5–8.1)

## 2020-08-27 LAB — URINE CULTURE
Culture: NO GROWTH
Special Requests: NORMAL

## 2020-08-27 LAB — AMYLASE: Amylase: 70 U/L (ref 28–100)

## 2020-08-27 LAB — GLUCOSE, CAPILLARY
Glucose-Capillary: 117 mg/dL — ABNORMAL HIGH (ref 70–99)
Glucose-Capillary: 184 mg/dL — ABNORMAL HIGH (ref 70–99)
Glucose-Capillary: 219 mg/dL — ABNORMAL HIGH (ref 70–99)
Glucose-Capillary: 204 mg/dL — ABNORMAL HIGH (ref 70–99)
Glucose-Capillary: 166 mg/dL — ABNORMAL HIGH (ref 70–99)

## 2020-08-27 LAB — PROTIME-INR
INR: 1.2 (ref 0.8–1.2)
INR: 1.1 (ref 0.8–1.2)
INR: 1.2 (ref 0.8–1.2)
INR: 1.1 (ref 0.8–1.2)
Prothrombin Time: 14.4 seconds (ref 11.4–15.2)
Prothrombin Time: 13.6 seconds (ref 11.4–15.2)
Prothrombin Time: 14.5 seconds (ref 11.4–15.2)
Prothrombin Time: 14.2 seconds (ref 11.4–15.2)

## 2020-08-27 LAB — HEMOGLOBIN A1C
Hgb A1c MFr Bld: 4.9 % (ref 4.8–5.6)
Mean Plasma Glucose: 93.93 mg/dL

## 2020-08-27 LAB — MAGNESIUM
Magnesium: 2.3 mg/dL (ref 1.7–2.4)
Magnesium: 2.3 mg/dL (ref 1.7–2.4)
Magnesium: 2.5 mg/dL — ABNORMAL HIGH (ref 1.7–2.4)
Magnesium: 2.5 mg/dL — ABNORMAL HIGH (ref 1.7–2.4)

## 2020-08-27 LAB — PHOSPHORUS
Phosphorus: 5.8 mg/dL — ABNORMAL HIGH (ref 2.5–4.6)
Phosphorus: 4.3 mg/dL (ref 2.5–4.6)
Phosphorus: 3.3 mg/dL (ref 2.5–4.6)
Phosphorus: 2.8 mg/dL (ref 2.5–4.6)

## 2020-08-27 LAB — LIPASE, BLOOD: Lipase: 22 U/L (ref 11–51)

## 2020-08-27 LAB — CK TOTAL AND CKMB (NOT AT ARMC)
CK, MB: 4.7 ng/mL (ref 0.5–5.0)
Relative Index: INVALID (ref 0.0–2.5)
Total CK: 81 U/L (ref 49–397)

## 2020-08-27 LAB — APTT
aPTT: 32 seconds (ref 24–36)
aPTT: 33 seconds (ref 24–36)
aPTT: 34 seconds (ref 24–36)
aPTT: 33 seconds (ref 24–36)

## 2020-08-27 MED ORDER — ALBUMIN HUMAN 25 % IV SOLN
25.0000 g | Freq: Once | INTRAVENOUS | Status: AC
  Administered 2020-08-27: 10:00:00 25 g via INTRAVENOUS
  Filled 2020-08-27: qty 100

## 2020-08-27 MED ORDER — ALBUMIN HUMAN 25 % IV SOLN
25.0000 g | Freq: Once | INTRAVENOUS | Status: AC
  Administered 2020-08-27: 22:00:00 25 g via INTRAVENOUS

## 2020-08-27 MED ORDER — FUROSEMIDE 10 MG/ML IJ SOLN
20.0000 mg | Freq: Once | INTRAMUSCULAR | Status: AC
  Administered 2020-08-27: 11:00:00 20 mg via INTRAVENOUS
  Filled 2020-08-27: qty 2

## 2020-08-27 MED ORDER — DESMOPRESSIN ACETATE 4 MCG/ML IJ SOLN
1.0000 ug | Freq: Once | INTRAMUSCULAR | Status: AC
  Administered 2020-08-27: 1 ug via INTRAVENOUS
  Filled 2020-08-27: qty 1

## 2020-08-27 MED ORDER — ALBUMIN HUMAN 25 % IV SOLN
12.5000 g | Freq: Once | INTRAVENOUS | Status: DC
  Filled 2020-08-27: qty 50

## 2020-08-27 MED ORDER — ALBUMIN HUMAN 25 % IV SOLN
25.0000 g | Freq: Once | INTRAVENOUS | Status: AC
  Administered 2020-08-27: 10:00:00 12.5 g via INTRAVENOUS

## 2020-08-27 MED ORDER — SODIUM CHLORIDE 0.9 % IV SOLN: INTRAVENOUS | Status: DC

## 2020-08-27 MED ORDER — DEXTROSE 5 % IV SOLN: INTRAVENOUS | Status: DC

## 2020-08-27 NOTE — Plan of Care (Signed)
  Problem: Clinical Measurements: Goal: Diagnostic test results will improve Outcome: Progressing Goal: Respiratory complications will improve Outcome: Progressing  Problem: Nutrition: Goal: Adequate nutrition will be maintained Outcome: Not Progressing   Problem: Elimination: Goal: Will not experience complications related to urinary retention Outcome: Progressing   Problem: Skin Integrity: Goal: Risk for impaired skin integrity will decrease Outcome: Progressing

## 2020-08-27 NOTE — Progress Notes (Addendum)
Pt placed in supine position. ETT holder placed. RT to continue to monitor.

## 2020-08-27 NOTE — Progress Notes (Signed)
CPT held at this time due to pt in prone position

## 2020-08-27 NOTE — Progress Notes (Signed)
  Echocardiogram Echocardiogram Transesophageal has been performed.  Gerda Diss 08/27/2020, 5:55 PM

## 2020-08-27 NOTE — Progress Notes (Signed)
Patient proned with RT assistance at 0000. Will remain proned for 8 hours.

## 2020-08-27 NOTE — Progress Notes (Signed)
Brief Nutrition Note:   Pt has been declared dead by brain-death evaluation. Noted plan is for organ donation and work-up in progress.   TF stopped yesterday by MD. Pt remains on vent support, in prone position currently.   No further nutrition interventions warranted at this time.  Please re-consult as needed.   Romelle Starcher MS, RDN, LDN, CNSC Registered Dietitian III Clinical Nutrition RD Pager and On-Call Pager Number Located in [place]

## 2020-08-27 NOTE — Progress Notes (Signed)
RT note. Pt. Head turned RT to Lt without any complications, RT able to pass cath.

## 2020-08-27 NOTE — Progress Notes (Signed)
CPT held due to pt in prone position.

## 2020-08-28 LAB — URINALYSIS, ROUTINE W REFLEX MICROSCOPIC
Bilirubin Urine: NEGATIVE
Glucose, UA: NEGATIVE mg/dL
Hgb urine dipstick: NEGATIVE
Ketones, ur: NEGATIVE mg/dL
Leukocytes,Ua: NEGATIVE
Nitrite: NEGATIVE
Protein, ur: NEGATIVE mg/dL
Specific Gravity, Urine: 1.021 (ref 1.005–1.030)
pH: 7 (ref 5.0–8.0)

## 2020-08-28 LAB — GLUCOSE, CAPILLARY
Glucose-Capillary: 129 mg/dL — ABNORMAL HIGH (ref 70–99)
Glucose-Capillary: 141 mg/dL — ABNORMAL HIGH (ref 70–99)
Glucose-Capillary: 160 mg/dL — ABNORMAL HIGH (ref 70–99)
Glucose-Capillary: 166 mg/dL — ABNORMAL HIGH (ref 70–99)

## 2020-08-28 LAB — ECHOCARDIOGRAM COMPLETE
Height: 69 in
Weight: 2462.1 oz

## 2020-08-28 LAB — CBC WITH DIFFERENTIAL/PLATELET
Abs Immature Granulocytes: 0.12 10*3/uL — ABNORMAL HIGH (ref 0.00–0.07)
Abs Immature Granulocytes: 0.17 10*3/uL — ABNORMAL HIGH (ref 0.00–0.07)
Basophils Absolute: 0 10*3/uL (ref 0.0–0.1)
Basophils Absolute: 0 10*3/uL (ref 0.0–0.1)
Basophils Relative: 0 %
Basophils Relative: 0 %
Eosinophils Absolute: 0 10*3/uL (ref 0.0–0.5)
Eosinophils Absolute: 0 10*3/uL (ref 0.0–0.5)
Eosinophils Relative: 0 %
Eosinophils Relative: 0 %
HCT: 31.6 % — ABNORMAL LOW (ref 39.0–52.0)
HCT: 32 % — ABNORMAL LOW (ref 39.0–52.0)
Hemoglobin: 10.3 g/dL — ABNORMAL LOW (ref 13.0–17.0)
Hemoglobin: 9.8 g/dL — ABNORMAL LOW (ref 13.0–17.0)
Immature Granulocytes: 1 %
Immature Granulocytes: 1 %
Lymphocytes Relative: 6 %
Lymphocytes Relative: 6 %
Lymphs Abs: 0.7 10*3/uL (ref 0.7–4.0)
Lymphs Abs: 0.8 10*3/uL (ref 0.7–4.0)
MCH: 31.1 pg (ref 26.0–34.0)
MCH: 29.6 pg (ref 26.0–34.0)
MCHC: 32.6 g/dL (ref 30.0–36.0)
MCHC: 30.6 g/dL (ref 30.0–36.0)
MCV: 95.5 fL (ref 80.0–100.0)
MCV: 96.7 fL (ref 80.0–100.0)
Monocytes Absolute: 0.8 10*3/uL (ref 0.1–1.0)
Monocytes Absolute: 1 10*3/uL (ref 0.1–1.0)
Monocytes Relative: 7 %
Monocytes Relative: 8 %
Neutro Abs: 10.5 10*3/uL — ABNORMAL HIGH (ref 1.7–7.7)
Neutro Abs: 11.6 10*3/uL — ABNORMAL HIGH (ref 1.7–7.7)
Neutrophils Relative %: 86 %
Neutrophils Relative %: 85 %
Platelets: 188 10*3/uL (ref 150–400)
Platelets: 212 10*3/uL (ref 150–400)
RBC: 3.31 MIL/uL — ABNORMAL LOW (ref 4.22–5.81)
RBC: 3.31 MIL/uL — ABNORMAL LOW (ref 4.22–5.81)
RDW: 14.6 % (ref 11.5–15.5)
RDW: 14.6 % (ref 11.5–15.5)
WBC: 12.2 10*3/uL — ABNORMAL HIGH (ref 4.0–10.5)
WBC: 13.6 10*3/uL — ABNORMAL HIGH (ref 4.0–10.5)
nRBC: 0 % (ref 0.0–0.2)
nRBC: 0 % (ref 0.0–0.2)

## 2020-08-28 LAB — BASIC METABOLIC PANEL
Anion gap: 10 (ref 5–15)
BUN: 21 mg/dL — ABNORMAL HIGH (ref 6–20)
CO2: 26 mmol/L (ref 22–32)
Calcium: 8.8 mg/dL — ABNORMAL LOW (ref 8.9–10.3)
Chloride: 122 mmol/L — ABNORMAL HIGH (ref 98–111)
Creatinine, Ser: 0.78 mg/dL (ref 0.61–1.24)
GFR, Estimated: >60 mL/min (ref >60)
Glucose, Bld: 147 mg/dL — ABNORMAL HIGH (ref 70–99)
Potassium: 3.5 mmol/L (ref 3.5–5.1)
Sodium: 158 mmol/L — ABNORMAL HIGH (ref 135–145)

## 2020-08-28 LAB — TROPONIN I (HIGH SENSITIVITY): Troponin I (High Sensitivity): 73 ng/L — ABNORMAL HIGH (ref <18)

## 2020-08-28 LAB — CULTURE, BLOOD (ROUTINE X 2)
Culture: NO GROWTH
Culture: NO GROWTH
Special Requests: ADEQUATE
Special Requests: ADEQUATE

## 2020-08-28 LAB — COMPREHENSIVE METABOLIC PANEL
ALT: 41 U/L (ref 0–44)
AST: 20 U/L (ref 15–41)
Albumin: 2.8 g/dL — ABNORMAL LOW (ref 3.5–5.0)
Alkaline Phosphatase: 78 U/L (ref 38–126)
Anion gap: 13 (ref 5–15)
BUN: 21 mg/dL — ABNORMAL HIGH (ref 6–20)
CO2: 26 mmol/L (ref 22–32)
Calcium: 8.9 mg/dL (ref 8.9–10.3)
Chloride: 123 mmol/L — ABNORMAL HIGH (ref 98–111)
Creatinine, Ser: 0.81 mg/dL (ref 0.61–1.24)
GFR, Estimated: >60 mL/min (ref >60)
Glucose, Bld: 177 mg/dL — ABNORMAL HIGH (ref 70–99)
Potassium: 3.8 mmol/L (ref 3.5–5.1)
Sodium: 162 mmol/L (ref 135–145)
Total Bilirubin: 0.5 mg/dL (ref 0.3–1.2)
Total Protein: 6.1 g/dL — ABNORMAL LOW (ref 6.5–8.1)

## 2020-08-28 LAB — PROTIME-INR
INR: 1.2 (ref 0.8–1.2)
INR: 1.2 (ref 0.8–1.2)
Prothrombin Time: 14.5 seconds (ref 11.4–15.2)
Prothrombin Time: 15.1 seconds (ref 11.4–15.2)

## 2020-08-28 LAB — PHOSPHORUS
Phosphorus: 2.3 mg/dL — ABNORMAL LOW (ref 2.5–4.6)
Phosphorus: 2.2 mg/dL — ABNORMAL LOW (ref 2.5–4.6)

## 2020-08-28 LAB — HEPATIC FUNCTION PANEL
ALT: 38 U/L (ref 0–44)
AST: 22 U/L (ref 15–41)
Albumin: 2.6 g/dL — ABNORMAL LOW (ref 3.5–5.0)
Alkaline Phosphatase: 85 U/L (ref 38–126)
Bilirubin, Direct: <0.1 mg/dL (ref 0.0–0.2)
Total Bilirubin: 0.6 mg/dL (ref 0.3–1.2)
Total Protein: 5.9 g/dL — ABNORMAL LOW (ref 6.5–8.1)

## 2020-08-28 LAB — POCT I-STAT 7, (LYTES, BLD GAS, ICA,H+H)
Acid-Base Excess: 5 mmol/L — ABNORMAL HIGH (ref 0.0–2.0)
Bicarbonate: 29.6 mmol/L — ABNORMAL HIGH (ref 20.0–28.0)
Calcium, Ion: 1.26 mmol/L (ref 1.15–1.40)
HCT: 28 % — ABNORMAL LOW (ref 39.0–52.0)
Hemoglobin: 9.5 g/dL — ABNORMAL LOW (ref 13.0–17.0)
O2 Saturation: 100 %
Patient temperature: 37.1
Potassium: 3.7 mmol/L (ref 3.5–5.1)
Sodium: 162 mmol/L (ref 135–145)
TCO2: 31 mmol/L (ref 22–32)
pCO2 arterial: 41.5 mmHg (ref 32.0–48.0)
pH, Arterial: 7.461 — ABNORMAL HIGH (ref 7.350–7.450)
pO2, Arterial: 178 mmHg — ABNORMAL HIGH (ref 83.0–108.0)

## 2020-08-28 LAB — APTT
aPTT: 32 seconds (ref 24–36)
aPTT: 32 seconds (ref 24–36)

## 2020-08-28 LAB — MAGNESIUM
Magnesium: 2.8 mg/dL — ABNORMAL HIGH (ref 1.7–2.4)
Magnesium: 2.4 mg/dL (ref 1.7–2.4)

## 2020-08-28 MED ORDER — IPRATROPIUM BROMIDE 0.02 % IN SOLN: 0.5000 mg | RESPIRATORY_TRACT | Status: DC

## 2020-08-28 MED ORDER — ALBUTEROL SULFATE (2.5 MG/3ML) 0.083% IN NEBU: 2.5000 mg | INHALATION_SOLUTION | Freq: Four times a day (QID) | RESPIRATORY_TRACT | Status: DC

## 2020-08-28 MED ORDER — POTASSIUM PHOSPHATES 15 MMOLE/5ML IV SOLN
15.0000 mmol | Freq: Once | INTRAVENOUS | Status: DC
  Filled 2020-08-28: qty 5

## 2020-08-29 LAB — CULTURE, RESPIRATORY W GRAM STAIN: Special Requests: NORMAL

## 2020-08-31 LAB — CULTURE, BLOOD (ROUTINE X 2)
Culture: NO GROWTH
Culture: NO GROWTH
Special Requests: ADEQUATE
Special Requests: ADEQUATE

## 2020-09-12 NOTE — Death Summary Note (Addendum)
DEATH SUMMARY   Patient Details  Name: Terry Mathews MRN: [id number] DOB: [birth year]  Admission/Discharge Information   Admit Date:  09/01/2020  Date of Death:   08/27/2020  Time of Death:  13:29  Length of Stay: 3  Referring Physician: No primary care provider on file.   Reason(s) for Hospitalization  Status post PEA cardiac arrest in the setting of polysubstance abuse Acute hypoxic respiratory failure Severe sepsis likely due to aspiration pneumonia (present on admission) Acute hypoxic/toxic encephalopathy High anion gap metabolic acidosis Acute kidney injury due to severe dehydration caused by DI Central DI Findings of brain herniation Acute systolic congestive heart failure  Diagnoses  Preliminary cause of death:  Secondary Diagnoses (including complications and co-morbidities):  Active Problems:   Cardiac arrest [facility](HCC)   Brief Hospital Course (including significant findings, care, treatment, and services provided and events leading to death)  Terry Mathews is a 30 y.o. year old male who was found down at [facility] in [place] followed by [facility]. CPR performed and achieved ROSC after given amiodarone 200mg , epi x 6, defib x1. Required intubation onsite  and was [facility]. Initially patient had brainstem reflexes including weak cough, corneal and respiratory triggering.  His pupils were mid dilated and fixed.  Initial head CT scan showed anoxic brain injury.  Patient had EEG which showed diffuse encephalopathy no seizures or myoclonus.  Patient rapidly progress and he lost his all brainstem reflexes.  Patient's family was found by police department and they were notified, patient mother came and informed about his condition and prognosis.  She agreed for brain death evaluation by apnea testing which was performed on 08/25/2020.  His initial PCO2 was 38 and with 8 minutes of apnea, his PCO2 went up to 78.  Patient was declared dead by brain-dead evaluation  and apnea testing on 09/01/2020 at 1:29 PM.   Pertinent Labs and Studies  Significant Diagnostic Studies EEG  Result Date: 08/24/2020 Charlsie QuestYadav, Priyanka O, MD     08/24/2020 10:15 AM Patient Name: Terry Mathews MRN: [id number] Epilepsy Attending: Charlsie QuestPriyanka O Yadav Referring Physician/Provider: Dr. Luciano CutterJane Chi Ellison Date: 08/24/2020 Duration: 23.26 minutes Patient history: Unknown age male status post cardiac arrest.  EEG to evaluate for seizures. Level of alertness: Comatose AEDs during EEG study: None Technical aspects: This EEG study was done with scalp electrodes positioned according to the 10-20 International system of electrode placement. Electrical activity was acquired at a sampling rate of 500Hz  and reviewed with a high frequency filter of 70Hz  and a low frequency filter of 1Hz . EEG data were recorded continuously and digitally stored. Description: EEG showed generalized 2 to 3 Hz delta slowing.  EEG was reactive to noxious stimulation.  Hyperventilation and photic stimulation were not performed.   ABNORMALITY -Continuous slow, generalized IMPRESSION: This study is suggestive of severe diffuse encephalopathy, nonspecific etiology but could be secondary to sedation, anoxic/hypoxic brain injury.  No seizures or epileptiform discharges were seen throughout the recording. Charlsie Questriyanka O Yadav   CT Head Wo Contrast  Result Date: 08/12/2020 CLINICAL DATA:  Found pulseless, apneic EXAM: CT HEAD WITHOUT CONTRAST CT CERVICAL SPINE WITHOUT CONTRAST TECHNIQUE: Multidetector CT imaging of the head and cervical spine was performed following the standard protocol without intravenous contrast. Multiplanar CT image reconstructions of the cervical spine were also generated. COMPARISON:  None. FINDINGS: CT HEAD FINDINGS Brain: There is subtle, diffuse hypodensity of the brain parenchyma, loss of gray-white distinction, sulcal effacement, and pseudohyperdensity and effacement of the basal  cisterns (series 3, image 13).  Vascular: No hyperdense vessel or unexpected calcification. Skull: Normal. Negative for fracture or focal lesion. Sinuses/Orbits: Mucosal thickening and partial opacification of ethmoid air cells, bilateral maxillary sinuses, and sphenoid sinuses. Other: None. CT CERVICAL SPINE FINDINGS Examination of the cervical spine is limited by motion artifact. Alignment: Straightening of the normal cervical lordosis, likely positional. Skull base and vertebrae: No definite evidence of fracture. There is a motion artifact extending through the base of the odontoid process and C2 vertebral body (series 7, image 29). No primary bone lesion or focal pathologic process. Soft tissues and spinal canal: No prevertebral fluid or swelling. No visible canal hematoma. Disc levels:  Intact. Upper chest: Negative. Other: None. IMPRESSION: 1. There is subtle, diffuse hypodensity of the brain parenchyma, loss of gray-white distinction, sulcal effacement, and pseudohyperdensity and effacement of the basal cisterns. Findings are consistent with diffuse cerebral edema and global anoxic injury. 2. Examination of the cervical spine is limited by motion artifact. No definite evidence of fracture or static subluxation of the cervical spine. There is a motion artifact extending through the base of the odontoid process and C2 vertebral body, not favored to reflect a fracture. These results were called by telephone at the time of interpretation on 08/15/2020 at 7:03 pm to Dr. Chaney Malling , who verbally acknowledged these results. Electronically Signed   By: Lauralyn Primes M.D.   On: 09/11/2020 19:07   CT Cervical Spine Wo Contrast  Result Date: 08/24/2020 CLINICAL DATA:  Found pulseless, apneic EXAM: CT HEAD WITHOUT CONTRAST CT CERVICAL SPINE WITHOUT CONTRAST TECHNIQUE: Multidetector CT imaging of the head and cervical spine was performed following the standard protocol without intravenous contrast. Multiplanar CT image reconstructions of the  cervical spine were also generated. COMPARISON:  None. FINDINGS: CT HEAD FINDINGS Brain: There is subtle, diffuse hypodensity of the brain parenchyma, loss of gray-white distinction, sulcal effacement, and pseudohyperdensity and effacement of the basal cisterns (series 3, image 13). Vascular: No hyperdense vessel or unexpected calcification. Skull: Normal. Negative for fracture or focal lesion. Sinuses/Orbits: Mucosal thickening and partial opacification of ethmoid air cells, bilateral maxillary sinuses, and sphenoid sinuses. Other: None. CT CERVICAL SPINE FINDINGS Examination of the cervical spine is limited by motion artifact. Alignment: Straightening of the normal cervical lordosis, likely positional. Skull base and vertebrae: No definite evidence of fracture. There is a motion artifact extending through the base of the odontoid process and C2 vertebral body (series 7, image 29). No primary bone lesion or focal pathologic process. Soft tissues and spinal canal: No prevertebral fluid or swelling. No visible canal hematoma. Disc levels:  Intact. Upper chest: Negative. Other: None. IMPRESSION: 1. There is subtle, diffuse hypodensity of the brain parenchyma, loss of gray-white distinction, sulcal effacement, and pseudohyperdensity and effacement of the basal cisterns. Findings are consistent with diffuse cerebral edema and global anoxic injury. 2. Examination of the cervical spine is limited by motion artifact. No definite evidence of fracture or static subluxation of the cervical spine. There is a motion artifact extending through the base of the odontoid process and C2 vertebral body, not favored to reflect a fracture. These results were called by telephone at the time of interpretation on 08/25/2020 at 7:03 pm to Dr. Chaney Malling , who verbally acknowledged these results. Electronically Signed   By: Lauralyn Primes M.D.   On: 08/16/2020 19:07   DG Chest Port 1 View  Result Date: 08/14/2020 CLINICAL DATA:   Intubation, central line placement EXAM: PORTABLE CHEST 1  VIEW COMPARISON:  None. FINDINGS: Endotracheal tube tip terminates in the mid to upper trachea, 6.2 cm from the carina. Transesophageal tube tip and side port terminate below the margins of imaging, beyond the GE junction. Telemetry leads and pacer pads overlie the chest wall. Central venous catheter is not visualized within the margins of imaging. There is some mild airways thickening and mixed streaky and patchy opacity in the medial lung bases. The cardiomediastinal contours are unremarkable. No acute osseous or soft tissue abnormality. IMPRESSION: 1. Endotracheal tube tip terminates in the mid to upper trachea, 6.2 cm from the carina. 2. Transesophageal tube tip and side port terminate below the margins of imaging, beyond the GE junction. 3. A central venous catheter is not visualized within the margins of imaging. 4. Airways thickening and mixed interstitial and patchy opacities, could reflect acute infection or inflammation in the appropriate clinical setting. Electronically Signed   By: Kreg Shropshire M.D.   On: 08/19/2020 17:48   ECHOCARDIOGRAM COMPLETE  Result Date: 08/24/2020    ECHOCARDIOGRAM REPORT   Patient Name:   Terry Mathews Mathews Date of Exam: 08/24/2020 Medical Rec #:  [id number]       Height:       69.0 in Accession #:    [id number]      Weight:       153.4 lb Date of Birth:  09/12/1875      BSA:          1.846 m Patient Age:    89 years       BP:           145/67 mmHg Patient Gender: M               HR:           92 bpm. Exam Location:  Inpatient Procedure: 2D Echo Indications:    cardiac arrest  History:        Patient has no prior history of Echocardiogram examinations.  Sonographer:    Delcie Roch Referring Phys: [id number] CHI JANE ELLISON  Sonographer Comments: Echo performed with patient supine and on artificial respirator, suboptimal apical window and suboptimal parasternal window. Image acquisition challenging due to  respiratory motion. IMPRESSIONS  1. Left ventricular ejection fraction, by estimation, is 20 to 25%. The left ventricle has severely decreased function. The left ventricle demonstrates global hypokinesis. Left ventricular diastolic function could not be evaluated.  2. Right ventricular systolic function is mildly reduced. The right ventricular size is normal.  3. The mitral valve was not assessed.  4. The aortic valve was not assessed.  5. The inferior vena cava is normal in size with <50% respiratory variability, suggesting right atrial pressure of 8 mmHg.  6. Extremely poor images. LV appears normal in size with severe systolic dysfunction. RV appears normal in size with mild systolic dysfunction. FINDINGS  Left Ventricle: Left ventricular ejection fraction, by estimation, is 20 to 25%. The left ventricle has severely decreased function. The left ventricle demonstrates global hypokinesis. The left ventricular internal cavity size was normal in size. There is no left ventricular hypertrophy. Left ventricular diastolic function could not be evaluated. Right Ventricle: The right ventricular size is normal. No increase in right ventricular wall thickness. Right ventricular systolic function is mildly reduced. Left Atrium: Left atrial size was not assessed. Right Atrium: Right atrial size was not assessed. Pericardium: There is no evidence of pericardial effusion. Mitral Valve: The mitral valve was not assessed. No evidence of mitral valve regurgitation.  Tricuspid Valve: The tricuspid valve is not assessed. Tricuspid valve regurgitation is not demonstrated. Aortic Valve: The aortic valve was not assessed. Aortic valve regurgitation is not visualized. Pulmonic Valve: The pulmonic valve was not assessed. Pulmonic valve regurgitation is not visualized. Aorta: Aortic root could not be assessed. Venous: The inferior vena cava is normal in size with less than 50% respiratory variability, suggesting right atrial pressure of 8  mmHg. IAS/Shunts: The interatrial septum was not assessed. Marca Ancona MD Electronically signed by Marca Ancona MD Signature Date/Time: 08/24/2020/4:01:19 PM    Final     Microbiology Recent Results (from the past 240 hour(s))  Blood culture (routine x 2)     Status: None (Preliminary result)   Collection Time: 08/15/2020  5:14 PM   Specimen: BLOOD  Result Value Ref Range Status   Specimen Description BLOOD SITE NOT SPECIFIED  Final   Special Requests   Final    BOTTLES DRAWN AEROBIC AND ANAEROBIC Blood Culture adequate volume   Culture   Final    NO GROWTH 3 DAYS Performed at [facility], [address] [place], [place] [address]    Report Status PENDING  Incomplete  Resp Panel by RT-PCR (Flu A&B, Covid) Nasopharyngeal Swab     Status: None   Collection Time: 08/21/2020  5:35 PM   Specimen: Nasopharyngeal Swab; Nasopharyngeal(NP) swabs in vial transport medium  Result Value Ref Range Status   SARS Coronavirus 2 by RT PCR NEGATIVE NEGATIVE Final    Comment: QA FLAGS AND/OR RANGES MODIFIED BY DEMOGRAPHIC UPDATE ON 12/12 AT 1911 QA FLAGS AND/OR RANGES MODIFIED BY DEMOGRAPHIC UPDATE ON 12/12 AT 1913 QA FLAGS AND/OR RANGES MODIFIED BY DEMOGRAPHIC UPDATE ON 12/12 AT 1913 (NOTE) SARS-CoV-2 target nucleic acids are NOT DETECTED.  The SARS-CoV-2 RNA is generally detectable in upper respiratory specimens during the acute phase of infection. The lowest concentration of SARS-CoV-2 viral copies this assay can detect is 138 copies/mL. A negative result does not preclude SARS-Cov-2 infection and should not be used as the sole basis for treatment or other patient management decisions. A negative result may occur with  improper specimen collection/handling, submission of specimen other than nasopharyngeal swab, presence of viral mutation(s) within the areas targeted by this assay, and inadequate number of viral copies(<138 copies/mL). A negative result must be combined with clinical  observations, patient history, and epidemiological info rmation. The expected result is Negative.  Fact Sheet for Patients:  BloggerCourse.com  Fact Sheet for Healthcare Providers:  SeriousBroker.it  This test is not yet approved or cleared by the [place] FDA and  has been authorized for detection and/or diagnosis of SARS-CoV-2 by FDA under an Emergency Use Authorization (EUA). This EUA will remain  in effect (meaning this test can be used) for the duration of the COVID-19 declaration under Section 564(b)(1) of the Act, 21 U.S.C.section 360bbb-3(b)(1), unless the authorization is terminated  or revoked sooner.       Influenza A by PCR NEGATIVE NEGATIVE Final    Comment: QA FLAGS AND/OR RANGES MODIFIED BY DEMOGRAPHIC UPDATE ON 12/12 AT 1911 QA FLAGS AND/OR RANGES MODIFIED BY DEMOGRAPHIC UPDATE ON 12/12 AT 1913 QA FLAGS AND/OR RANGES MODIFIED BY DEMOGRAPHIC UPDATE ON 12/12 AT 1913    Influenza B by PCR NEGATIVE NEGATIVE Final    Comment: QA FLAGS AND/OR RANGES MODIFIED BY DEMOGRAPHIC UPDATE ON 12/12 AT 1911 QA FLAGS AND/OR RANGES MODIFIED BY DEMOGRAPHIC UPDATE ON 12/12 AT 1913 QA FLAGS AND/OR RANGES MODIFIED BY DEMOGRAPHIC UPDATE ON  12/12 AT 1913 (NOTE) The Xpert Xpress SARS-CoV-2/FLU/RSV plus assay is intended as an aid in the diagnosis of influenza from Nasopharyngeal swab specimens and should not be used as a sole basis for treatment. Nasal washings and aspirates are unacceptable for Xpert Xpress SARS-CoV-2/FLU/RSV testing.  Fact Sheet for Patients: BloggerCourse.com  Fact Sheet for Healthcare Providers: SeriousBroker.it  This test is not yet approved or cleared by the [place] FDA and has been authorized for detection and/or diagnosis of SARS-CoV-2 by FDA under an Emergency Use Authorization (EUA). This EUA will remain in effect (meaning this test can be used) for  the duration of the COVID-19 declaration under Section 564(b)(1) of the Act, 21  U.S.C. section 360bbb-3(b)(1), unless the authorization is terminated or revoked.  Performed at [facility], [address] [place], [place] [address]   Blood culture (routine x 2)     Status: None (Preliminary result)   Collection Time: 09/19/2020 10:30 PM   Specimen: BLOOD  Result Value Ref Range Status   Specimen Description BLOOD LEFT ANTECUBITAL  Final   Special Requests   Final    BOTTLES DRAWN AEROBIC ONLY Blood Culture adequate volume   Culture   Final    NO GROWTH 3 DAYS Performed at [facility], [address] [place], [place] [address]    Report Status PENDING  Incomplete  MRSA PCR Screening     Status: Abnormal   Collection Time: 08/24/20 10:14 AM   Specimen: Nasopharyngeal  Result Value Ref Range Status   MRSA by PCR POSITIVE (A) NEGATIVE Final    Comment:        The GeneXpert MRSA Assay (FDA approved for NASAL specimens only), is one component of a comprehensive MRSA colonization surveillance program. It is not intended to diagnose MRSA infection nor to guide or monitor treatment for MRSA infections. RESULT CALLED TO, READ BACK BY AND VERIFIED WITH: Kenyon Ana RN 12:50 08/24/20 ([email or web address]) Performed at [facility], [address], [place], [place] [address]   Culture, respiratory (non-expectorated)     Status: None (Preliminary result)   Collection Time: 09/04/2020  3:48 AM   Specimen: Tracheal Aspirate; Respiratory  Result Value Ref Range Status   Specimen Description TRACHEAL ASPIRATE  Final   Special Requests Normal  Final   Gram Stain   Final    RARE WBC PRESENT, PREDOMINANTLY PMN FEW YEAST RARE GRAM POSITIVE RODS Performed at [facility], [address], [place], [place] [address]    Culture PENDING  Incomplete   Report Status PENDING  Incomplete    Lab Basic Metabolic Panel: Recent Labs  Lab 19-Sep-2020 1658 2020-09-19 1739 09-19-20 1750  2020-09-19 1935 08/24/20 0149 08/24/20 1543 08/25/20 0320 08/25/20 1804 08/16/2020 0404 08/16/2020 1253 08/19/2020 1317 08/24/2020 1329  NA 134*  134*   < > 133*   < > 139  --  151*  --  153* 155* 154* 155*  K 4.4  4.4   < > 4.3   < > 3.4*  --  3.2*  --  2.6* 3.3* 3.1* 3.0*  CL 103  103  --  97*  --  97*  --  101  --  111  --   --   --   CO2  --   --  17*  --  26  --  30  --  30  --   --   --   GLUCOSE 196*  196*  --  266*  --  178*  --  85  --  120*  --   --   --   BUN 7  7*  --  6  --  14  --  21*  --  40*  --   --   --   CREATININE 0.70  0.70  --  1.05  --  0.96  --  1.23  --  1.25*  --   --   --   CALCIUM  --   --  10.6*  --  8.7*  --  8.2*  --  8.1*  --   --   --   MG  --    < > 4.6*  --  2.4 2.1 2.5* 2.6* 2.7*  --   --   --   PHOS  --   --   --   --   --  4.2 4.7* 2.0* 2.0*  --   --   --    < > = values in this interval not displayed.   Liver Function Tests: Recent Labs  Lab 08/27/2020 1750  AST 97*  ALT 62*  ALKPHOS 96  BILITOT 0.7  PROT 4.7*  ALBUMIN 2.5*   No results for input(s): LIPASE, AMYLASE in the last 168 hours. No results for input(s): AMMONIA in the last 168 hours. CBC: Recent Labs  Lab 08/18/2020 1749 09/02/2020 1935 08/24/20 0149 September 03, 2020 0404 09/03/2020 1253 Sep 03, 2020 1317 03-Sep-2020 1329  WBC 10.6*  --  16.5* 23.1*  --   --   --   NEUTROABS 3.5  --   --   --   --   --   --   HGB 12.1*   < > 14.9 12.1* 11.2* 10.5* 11.9*  HCT 40.5   < > 44.2 37.6* 33.0* 31.0* 35.0*  MCV 102.8*  --  89.3 92.8  --   --   --   PLT 252  --  408* 288  --   --   --    < > = values in this interval not displayed.   Cardiac Enzymes: No results for input(s): CKTOTAL, CKMB, CKMBINDEX, TROPONINI in the last 168 hours. Sepsis Labs: Recent Labs  Lab 09/06/2020 1749 08/20/2020 1753 08/24/2020 1933 08/22/2020 2230 08/24/20 0149 09-03-2020 0404  WBC 10.6*  --   --   --  16.5* 23.1*  LATICACIDVEN  --  >11.0* 7.8* 4.2* 3.2*  --     Procedures/Operations     Keamber Macfadden 03-Sep-2020,  1:43 PM

## 2020-09-12 NOTE — Progress Notes (Signed)
CRITICAL VALUE ALERT  Critical Value:  Sodium 162  Date & Time Notied:  09/08/2020 0440  Provider Notified: Howard Pouch Supervisor Arrie Senate  Orders Received/Actions taken: Instructions to put free water (200 mL) down OG tube, clamp for 1 hour, suction, then repeat

## 2020-09-12 NOTE — Care Management (Signed)
2020/09/10 1230 Case Manager was called by Staff RN to assist with transport to [facility] for Terry Mathews via [employer]. Case Manager completed medical necessity and printed transport records. AC notified of transport to [facility]. MD was notified to do [employer] care had been given to Terry Mathews no EMTALA needed. Case Manager spoke with Terry Mathews representative and she confirmed. No further needs from Case Manager at this time. Tomi Bamberger, RN,BSN Case Manager

## 2020-09-12 NOTE — Progress Notes (Signed)
   08/20/2020 1300  Clinical Encounter Type  Visited With Patient and family together  Visit Type Patient actively dying  Referral From Nurse  Consult/Referral To Chaplain  Spiritual Encounters  Spiritual Needs Prayer;Emotional;Grief support  Stress Factors  Patient Stress Factors Loss  Family Stress Factors Loss  Patient's mother gave permission to [facility] to donate organs. Patient transferred to [facility]. Chaplain Purnell Shoemaker and [employer] were with patient's mother and stepfather until patient left the hospital. Chaplains offered prayer and emotional support.

## 2020-09-12 DEATH — deceased

## 2020-09-14 LAB — DRUG SCREEN 10 W/CONF, SERUM
Amphetamines, IA: NEGATIVE ng/mL
Barbiturates, IA: NEGATIVE ug/mL
Benzodiazepines, IA: POSITIVE ng/mL — AB
Cocaine & Metabolite, IA: NEGATIVE ng/mL
Methadone, IA: NEGATIVE ng/mL
Opiates, IA: NEGATIVE ng/mL
Oxycodones, IA: NEGATIVE ng/mL
Phencyclidine, IA: NEGATIVE ng/mL
Propoxyphene, IA: NEGATIVE ng/mL

## 2022-01-17 IMAGING — CT CT CERVICAL SPINE W/O CM
3 of 4 series · 12 of 33 positions shown, 14 images · non-contrast
Comparison: None.

CLINICAL DATA: Found pulseless, apneic

EXAM:
CT HEAD WITHOUT CONTRAST
CT CERVICAL SPINE WITHOUT CONTRAST
TECHNIQUE: Multidetector CT imaging of the head and cervical spine was
performed following the standard protocol without intravenous
contrast. Multiplanar CT image reconstructions of the cervical spine
were also generated.

[Series 5: c_spine 2.0 st · axial · 0.34mm/px · z∈[+1117,+1247]mm · 4 of 93 slices shown, 5 images]
[im 14/93  soft-tissue]
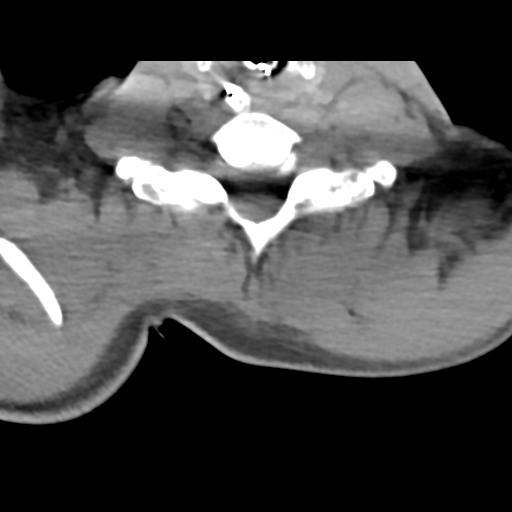
[im 14/93  bone]
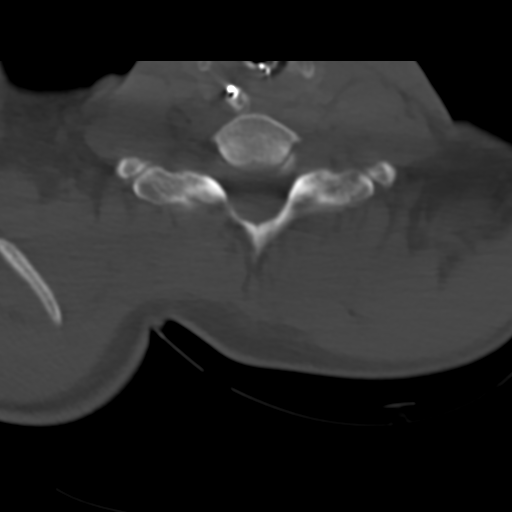
[im 40/93  bone]
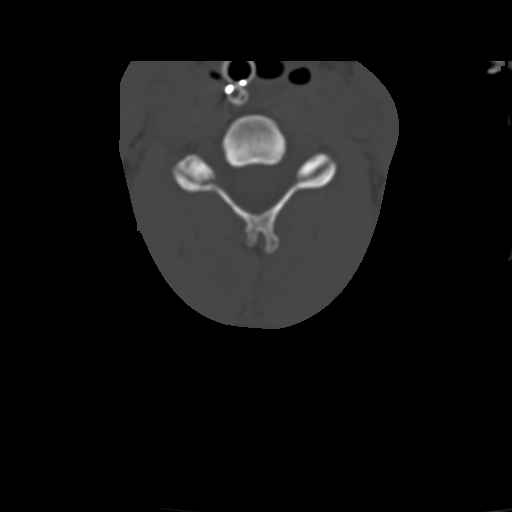
[im 53/93  bone]
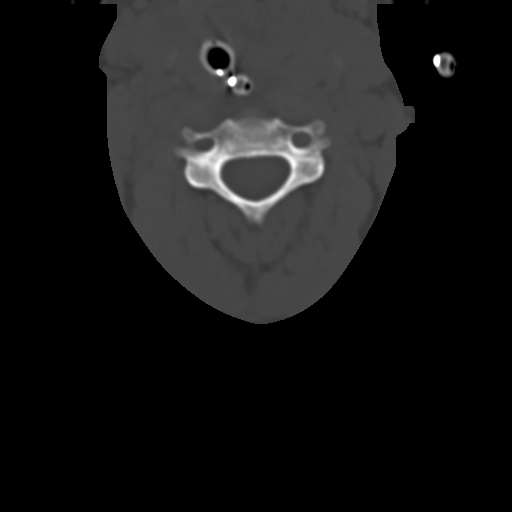
[im 79/93  bone]
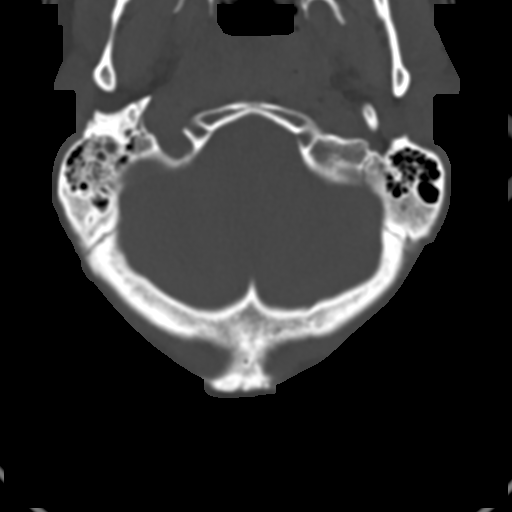

[Series 6: coronal bone · coronal · 0.25mm/px · 3 of 61 slices shown]
[im 13/61  bone]
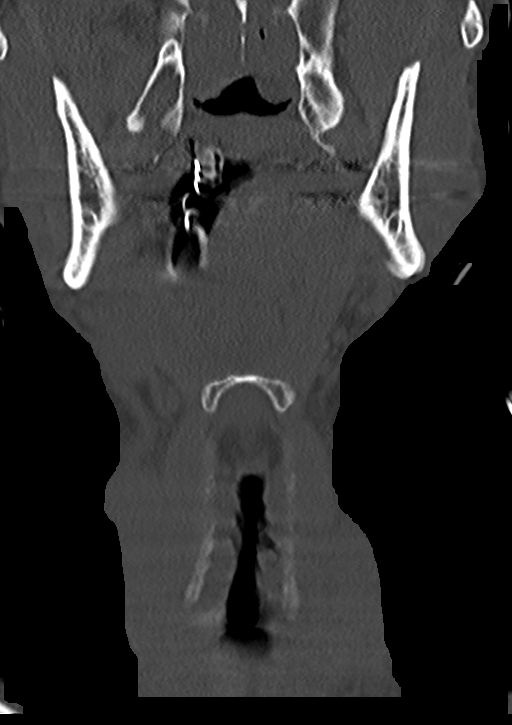
[im 25/61  bone]
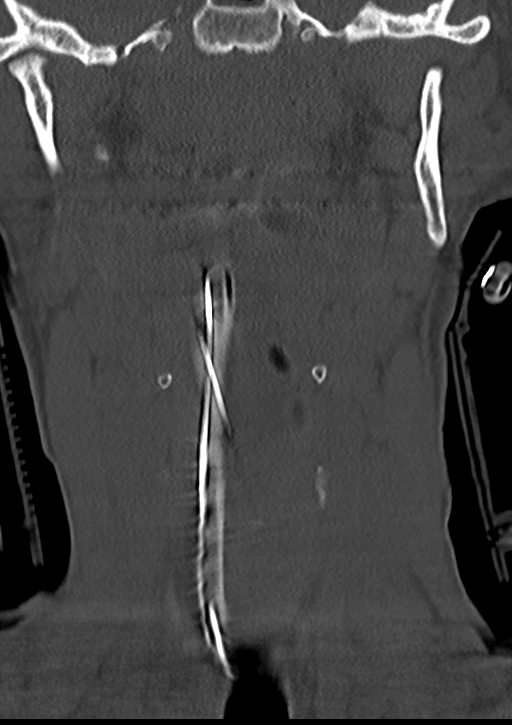
[im 37/61  bone]
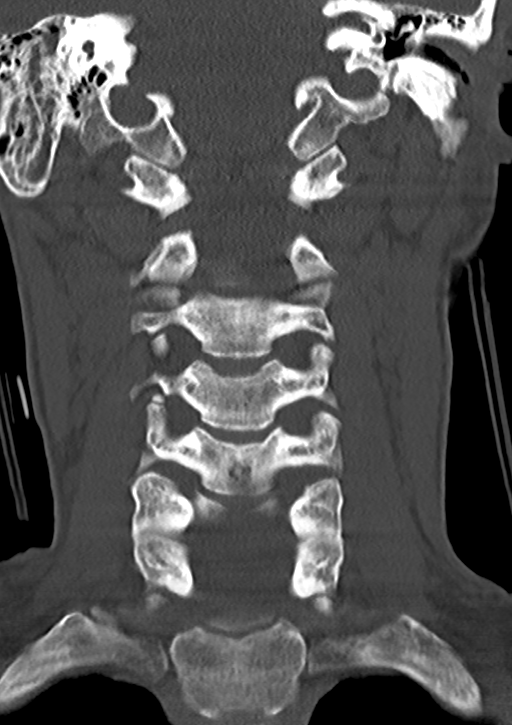

[Series 7: sagittal bone · sagittal · 0.23mm/px · 5 of 61 slices shown, 6 images]
[im 21/61  bone]
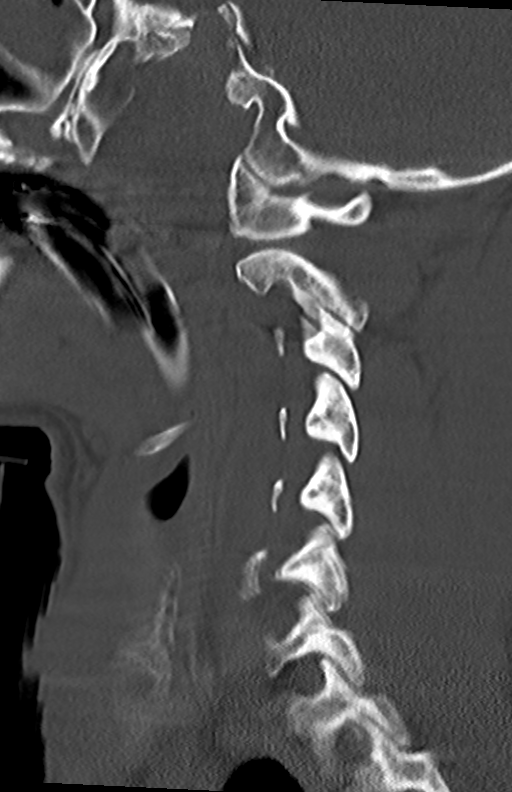
[im 26/61  bone]
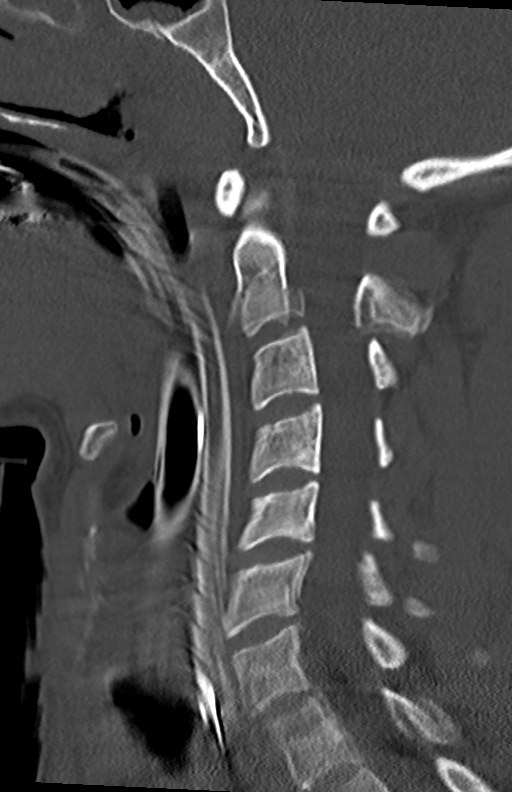
[im 31/61  soft-tissue]
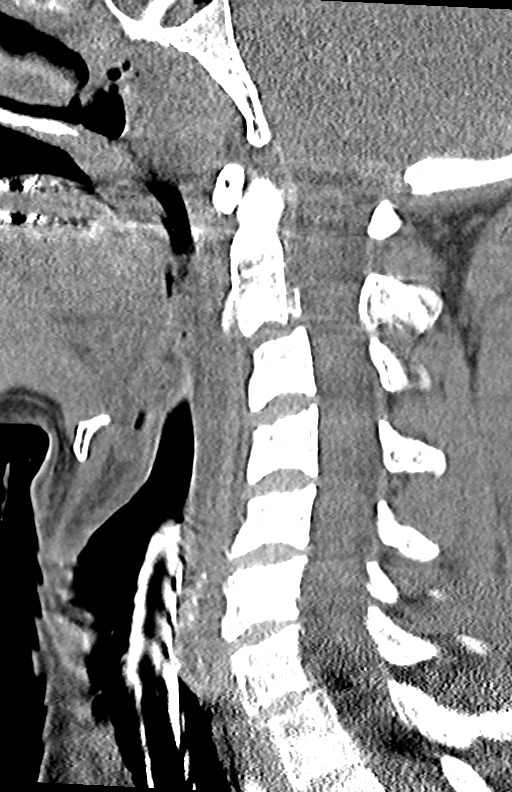
[im 31/61  bone]
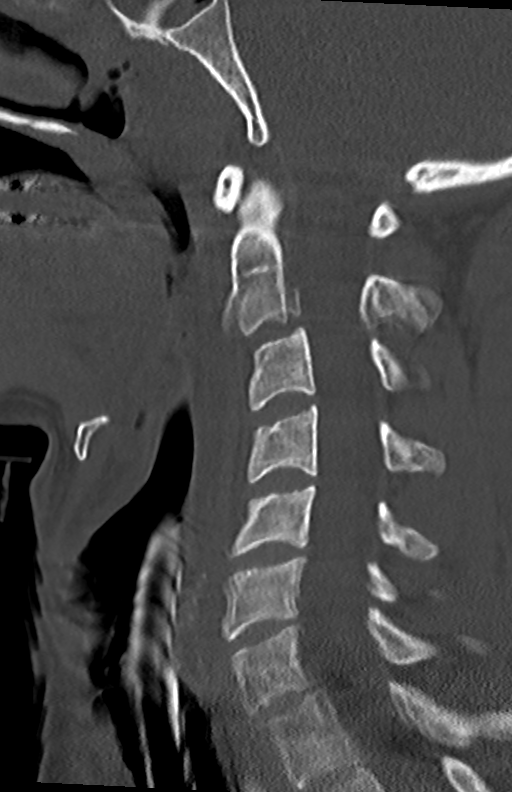
[im 36/61  bone]
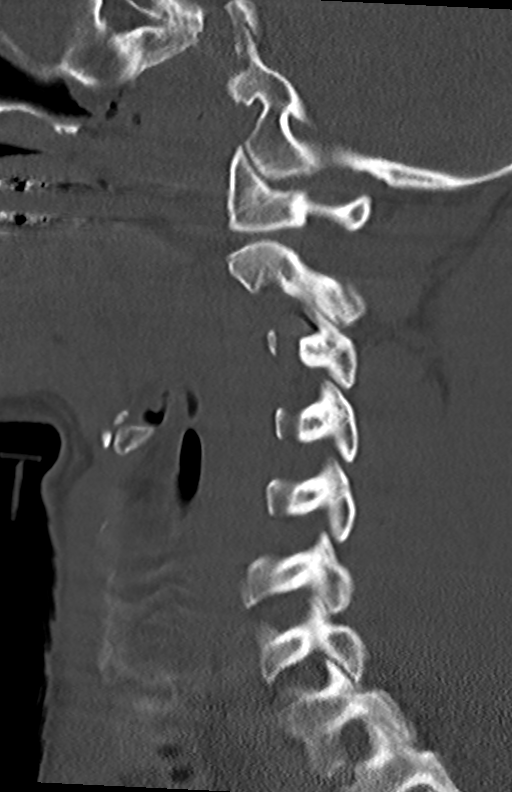
[im 41/61  bone]
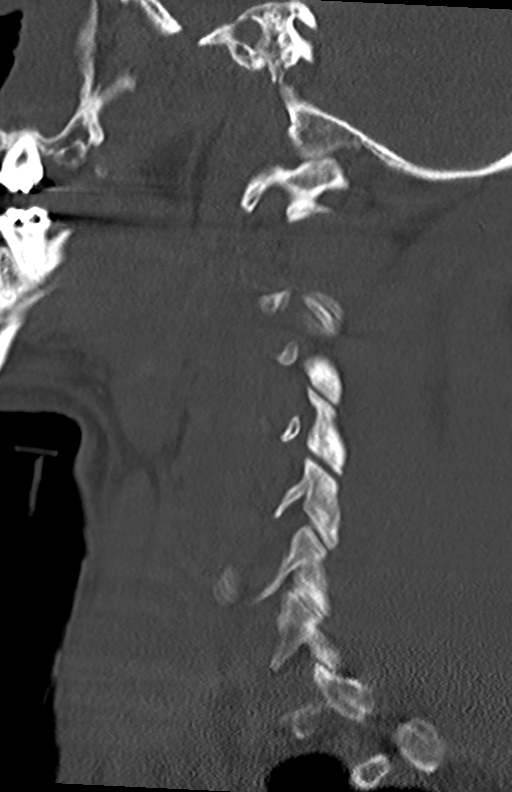

[12 of 33 positions shown; findings below may reference images not displayed]

FINDINGS: CT HEAD FINDINGS

Brain: There is subtle, diffuse hypodensity of the brain parenchyma,
loss of gray-white distinction, sulcal effacement, and
pseudohyperdensity and effacement of the basal cisterns (series 3,
image 13).

Vascular: No hyperdense vessel or unexpected calcification.

Skull: Normal. Negative for fracture or focal lesion.

Sinuses/Orbits: Mucosal thickening and partial opacification of
ethmoid air cells, bilateral maxillary sinuses, and sphenoid
sinuses.

Other: None.

CT CERVICAL SPINE FINDINGS

Examination of the cervical spine is limited by motion artifact.

Alignment: Straightening of the normal cervical lordosis, likely
positional.

Skull base and vertebrae: No definite evidence of fracture. There is
a motion artifact extending through the base of the odontoid process
and C2 vertebral body (series 7, image 29). No primary bone lesion
or focal pathologic process.

Soft tissues and spinal canal: No prevertebral fluid or swelling. No
visible canal hematoma.

Disc levels:  Intact.

Upper chest: Negative.

Other: None.
IMPRESSION: 1. There is subtle, diffuse hypodensity of the brain parenchyma,
loss of gray-white distinction, sulcal effacement, and
pseudohyperdensity and effacement of the basal cisterns. Findings
are consistent with diffuse cerebral edema and global anoxic injury.
2. Examination of the cervical spine is limited by motion artifact.
No definite evidence of fracture or static subluxation of the
cervical spine. There is a motion artifact extending through the
base of the odontoid process and C2 vertebral body, not favored to
reflect a fracture.

These results were called by telephone at the time of interpretation
on 08/23/2020 at [DATE] to Dr. KLPIGBB MOOLMAN , who verbally
acknowledged these results.
